# Patient Record
Sex: Female | Born: 1935 | Race: Black or African American | Hispanic: No | Smoking: Never smoker
Health system: Southern US, Community
[De-identification: ages and names within clinical notes are randomized; demographics above are authoritative.]

## PROBLEM LIST (undated history)

## (undated) DIAGNOSIS — G309 Alzheimer's disease, unspecified: Secondary | ICD-10-CM

## (undated) DIAGNOSIS — M1990 Unspecified osteoarthritis, unspecified site: Secondary | ICD-10-CM

## (undated) DIAGNOSIS — I10 Essential (primary) hypertension: Secondary | ICD-10-CM

## (undated) DIAGNOSIS — F0280 Dementia in other diseases classified elsewhere without behavioral disturbance: Secondary | ICD-10-CM

## (undated) DIAGNOSIS — E139 Other specified diabetes mellitus without complications: Secondary | ICD-10-CM

## (undated) HISTORY — PX: EYE SURGERY: SHX253

---

## 2003-03-11 ENCOUNTER — Emergency Department (HOSPITAL_COMMUNITY): Admission: EM | Admit: 2003-03-11 | Discharge: 2003-03-11 | Payer: Self-pay | Admitting: Emergency Medicine

## 2003-10-08 ENCOUNTER — Ambulatory Visit (HOSPITAL_COMMUNITY): Admission: RE | Admit: 2003-10-08 | Discharge: 2003-10-08 | Payer: Self-pay | Admitting: Gastroenterology

## 2003-11-27 ENCOUNTER — Emergency Department (HOSPITAL_COMMUNITY): Admission: EM | Admit: 2003-11-27 | Discharge: 2003-11-28 | Payer: Self-pay | Admitting: Emergency Medicine

## 2004-09-09 ENCOUNTER — Emergency Department (HOSPITAL_COMMUNITY): Admission: EM | Admit: 2004-09-09 | Discharge: 2004-09-09 | Payer: Self-pay | Admitting: Emergency Medicine

## 2007-01-17 ENCOUNTER — Emergency Department (HOSPITAL_COMMUNITY): Admission: EM | Admit: 2007-01-17 | Discharge: 2007-01-17 | Payer: Self-pay | Admitting: Emergency Medicine

## 2007-02-04 ENCOUNTER — Emergency Department (HOSPITAL_COMMUNITY): Admission: EM | Admit: 2007-02-04 | Discharge: 2007-02-04 | Payer: Self-pay | Admitting: Emergency Medicine

## 2007-10-17 ENCOUNTER — Ambulatory Visit: Payer: Self-pay | Admitting: Gastroenterology

## 2008-11-01 ENCOUNTER — Emergency Department (HOSPITAL_COMMUNITY): Admission: EM | Admit: 2008-11-01 | Discharge: 2008-11-01 | Payer: Self-pay | Admitting: Emergency Medicine

## 2010-01-20 ENCOUNTER — Telehealth: Payer: Self-pay | Admitting: Gastroenterology

## 2010-09-21 NOTE — Progress Notes (Signed)
Summary: Schedule NP3 to Discuss Colonoscopy  Phone Note Outgoing Call Call back at [facility] Phone [phone]   Call placed by: Harlow Mares CMA Duncan Dull),  January 20, 2010 10:09 AM Call placed to: Patient Summary of Call: Left message on patients machine to call back. patient due for colonoscopy but will need office visit to discuss colonoscopy due to age Initial call taken by: Harlow Mares CMA Duncan Dull),  January 20, 2010 10:10 AM  Follow-up for Phone Call        brother Morley Kos -[phone] called and wants to let you know that Mrs. Norbeck might not be able to get col done because of full blown Alzeimher but would like to speak to you. Follow-up by: ES, [facility]  Additional Follow-up for Phone Call Additional follow up Details #1::        Left message on patients machine to call back.  Additional Follow-up by: Harlow Mares CMA Duncan Dull),  January 30, 2010 2:32 PM    Additional Follow-up for Phone Call Additional follow up Details #2::    Her POA takes care of her but she stays in daycare during the day and he does not think that in her current mental status she can handle the prep and Colonoscopy. Her Alzeimhers is progressive and he does not feel that a colonoscopy or an office visit is needed at this point. He would like to let Dr. Russella Dar know.  Follow-up by: Harlow Mares CMA Duncan Dull),  January 30, 2010 2:49 PM  Additional Follow-up for Phone Call Additional follow up Details #3:: Details for Additional Follow-up Action Taken: Given progressive dementia ok to cancel recall colonoscopy as they suggest. Additional Follow-up by: Meryl Dare MD Clementeen Graham,  February 05, 2010 5:57 PM

## 2010-11-30 LAB — BASIC METABOLIC PANEL
BUN: 29 mg/dL — ABNORMAL HIGH (ref 6–23)
CO2: 26 mEq/L (ref 19–32)
Calcium: 9.4 mg/dL (ref 8.4–10.5)
Chloride: 104 mEq/L (ref 96–112)
Creatinine, Ser: 1.54 mg/dL — ABNORMAL HIGH (ref 0.4–1.2)
GFR calc Af Amer: 39 mL/min — ABNORMAL LOW (ref >60)
GFR calc non Af Amer: 32 mL/min — ABNORMAL LOW (ref >60)
Glucose, Bld: 240 mg/dL — ABNORMAL HIGH (ref 70–99)
Potassium: 4.4 mEq/L (ref 3.5–5.1)
Sodium: 135 mEq/L (ref 135–145)

## 2010-11-30 LAB — URINALYSIS, ROUTINE W REFLEX MICROSCOPIC
Bilirubin Urine: NEGATIVE
Glucose, UA: NEGATIVE mg/dL
Hgb urine dipstick: NEGATIVE
Ketones, ur: NEGATIVE mg/dL
Nitrite: NEGATIVE
Protein, ur: 30 mg/dL — AB
Specific Gravity, Urine: 1.016 (ref 1.005–1.030)
Urobilinogen, UA: 0.2 mg/dL (ref 0.0–1.0)
pH: 6 (ref 5.0–8.0)

## 2010-11-30 LAB — CBC
HCT: 31.7 % — ABNORMAL LOW (ref 36.0–46.0)
Hemoglobin: 10.2 g/dL — ABNORMAL LOW (ref 12.0–15.0)
MCHC: 32.2 g/dL (ref 30.0–36.0)
MCV: 86.3 fL (ref 78.0–100.0)
Platelets: 270 10*3/uL (ref 150–400)
RBC: 3.68 MIL/uL — ABNORMAL LOW (ref 3.87–5.11)
RDW: 15.5 % (ref 11.5–15.5)
WBC: 8.4 10*3/uL (ref 4.0–10.5)

## 2010-11-30 LAB — DIFFERENTIAL
Basophils Absolute: 0 10*3/uL (ref 0.0–0.1)
Basophils Relative: 0 % (ref 0–1)
Eosinophils Absolute: 0 10*3/uL (ref 0.0–0.7)
Eosinophils Relative: 0 % (ref 0–5)
Lymphocytes Relative: 15 % (ref 12–46)
Lymphs Abs: 1.3 10*3/uL (ref 0.7–4.0)
Monocytes Absolute: 0.7 10*3/uL (ref 0.1–1.0)
Monocytes Relative: 8 % (ref 3–12)
Neutro Abs: 6.4 10*3/uL (ref 1.7–7.7)
Neutrophils Relative %: 76 % (ref 43–77)

## 2010-11-30 LAB — URINE MICROSCOPIC-ADD ON

## 2011-01-02 NOTE — Assessment & Plan Note (Signed)
[facility]                         GASTROENTEROLOGY OFFICE NOTE   Kristina Alvarez, Kristina Alvarez                     MRN:          [id number]  DATE:10/17/2007                            DOB:          Mar 02, 1926    REFERRING PHYSICIAN:  Geoffry Paradise, M.D.   REASON FOR CONSULT:  Suspected hematochezia.   HISTORY OF PRESENT ILLNESS:  Kristina Alvarez is a 77 year old white female,  who has noted small amounts of blood in her Depends.  Initially, vaginal  bleeding was suspected and she was evaluated by Dr. Tracey Harries and  apparently that evaluation was unremarkable and she is now referred to  me for possible hematochezia.  The patient relates that her brother  spotted blood in the bed and in a Depends on two occasions.  It also has  been noted on one occasion, related to a bowel movement.  She previously  underwent colonoscopy in February of 2005, which showed internal and  external hemorrhoids.  A polypoid area of mucosa was removed, which did  not show adenomatous tissue.  She has had occasional episodes of  diarrhea, which have resolved with Imodium.  She notes no other  gastrointestinal symptoms and specifically denies any abdominal pain,  rectal pain, weight-loss or change in stool caliber.  There is no family  history of colon cancer, colon polyps or inflammatory bowel disease.   PAST MEDICAL HISTORY:  Diabetes mellitus  hypertension  anxiety  eczema  sleep apnea.   PAST SURGICAL HISTORY:  Status post tonsillectomy and adenoidectomy.   CURRENT MEDICATIONS:  Listed on the chart, updated and reviewed.   MEDICATION ALLERGIES:  SULFA DRUGS.   SOCIAL HISTORY:  Per the handwritten form.   REVIEW OF SYSTEMS:  Per the handwritten form.   PHYSICAL EXAM:  A thin, elderly, female, in no acute distress.  Height 5 feet 3 inches, weight 105 pounds, blood pressure is 114/60,  pulse 64 and regular.  HEENT EXAM:  Anicteric sclerae.  Oropharynx clear.  CHEST:   Clear to auscultation bilaterally.  CARDIAC:  3/6 systolic murmur, regular rate and rhythm.  ABDOMEN:  Soft, nontender, nondistended.  Normoactive bowel sounds.  No  palpable organomegaly, masses or hernias.  RECTAL EXAMINATION:  Reveals external hemorrhoids, no internal lesions  and brown, trace Hemoccult-positive stool in the vault.  EXTREMITIES:  Without clubbing, cyanosis or edema.  NEUROLOGIC:  Alert and oriented times three.   ASSESSMENT AND PLAN:  Small-volume hematochezia with trace Hemoccult-  positive stool.  She has external hemorrhoids and a history of internal  hemorrhoids, and these are likely the source of her bleeding.  Begin  Anusol-HC suppositories q.h.s. and Anusol HC cream bid for the next two  weeks and then she may use these on a p.r.n. basis.  All standard  hemorrhoidal care instructions were supplied to her.  If her symptoms  persist, consider a flexible sigmoidoscopy or colonoscopy for further  evaluation.     [name]. Russella Dar, MD, [facility]  Electronically Signed    MTS/MedQ  DD: 10/24/2007  DT: 10/24/2007  Job #: [id number]

## 2012-05-15 ENCOUNTER — Emergency Department (HOSPITAL_COMMUNITY)
Admission: EM | Admit: 2012-05-15 | Discharge: 2012-05-15 | Disposition: A | Discharge status: Home / Self Care | Payer: Medicare Other | Attending: Emergency Medicine | Admitting: Emergency Medicine

## 2012-05-15 ENCOUNTER — Emergency Department (HOSPITAL_COMMUNITY): Payer: Medicare Other

## 2012-05-15 DIAGNOSIS — E860 Dehydration: Secondary | ICD-10-CM

## 2012-05-15 DIAGNOSIS — R1110 Vomiting, unspecified: Secondary | ICD-10-CM | POA: Insufficient documentation

## 2012-05-15 DIAGNOSIS — R079 Chest pain, unspecified: Secondary | ICD-10-CM | POA: Insufficient documentation

## 2012-05-15 DIAGNOSIS — F0390 Unspecified dementia without behavioral disturbance: Secondary | ICD-10-CM | POA: Insufficient documentation

## 2012-05-15 DIAGNOSIS — Z79899 Other long term (current) drug therapy: Secondary | ICD-10-CM | POA: Insufficient documentation

## 2012-05-15 LAB — URINALYSIS, ROUTINE W REFLEX MICROSCOPIC
Bilirubin Urine: NEGATIVE
Glucose, UA: NEGATIVE mg/dL
Hgb urine dipstick: NEGATIVE
Protein, ur: NEGATIVE mg/dL
Specific Gravity, Urine: 1.012 (ref 1.005–1.030)

## 2012-05-15 LAB — COMPREHENSIVE METABOLIC PANEL
ALT: 10 U/L (ref 0–35)
AST: 20 U/L (ref 0–37)
Albumin: 3.2 g/dL — ABNORMAL LOW (ref 3.5–5.2)
CO2: 23 mEq/L (ref 19–32)
Chloride: 100 mEq/L (ref 96–112)
Creatinine, Ser: 1.23 mg/dL — ABNORMAL HIGH (ref 0.50–1.10)
GFR calc non Af Amer: 39 mL/min — ABNORMAL LOW (ref >90)
Sodium: 133 mEq/L — ABNORMAL LOW (ref 135–145)
Total Bilirubin: 0.2 mg/dL — ABNORMAL LOW (ref 0.3–1.2)

## 2012-05-15 LAB — CBC WITH DIFFERENTIAL/PLATELET
Basophils Absolute: 0 10*3/uL (ref 0.0–0.1)
Basophils Relative: 0 % (ref 0–1)
HCT: 32.6 % — ABNORMAL LOW (ref 36.0–46.0)
Lymphocytes Relative: 20 % (ref 12–46)
MCHC: 32.2 g/dL (ref 30.0–36.0)
Monocytes Absolute: 0.5 10*3/uL (ref 0.1–1.0)
Neutro Abs: 5.8 10*3/uL (ref 1.7–7.7)
Neutrophils Relative %: 72 % (ref 43–77)
Platelets: 240 10*3/uL (ref 150–400)
RDW: 15.2 % (ref 11.5–15.5)

## 2012-05-15 MED ORDER — SODIUM CHLORIDE 0.9 % IV BOLUS (SEPSIS)
1000.0000 mL | Freq: Once | INTRAVENOUS | Status: AC
  Administered 2012-05-15: 1000 mL via INTRAVENOUS

## 2012-05-15 MED ORDER — SODIUM CHLORIDE 0.9 % IV BOLUS (SEPSIS): 1000.0000 mL | Freq: Once | INTRAVENOUS | Status: DC

## 2012-05-15 NOTE — ED Provider Notes (Signed)
History     CSN: [id number]  Arrival date & time 05/15/12  1133   First MD Initiated Contact with Patient 05/15/12 1157      Chief Complaint  Patient presents with  . Nausea    (Consider location/radiation/quality/duration/timing/severity/associated sxs/prior treatment) HPI Comments: 78 year old female presents to the emergency department via EMS [facility] do to an episode of nausea and vomiting. He told EMS that she appeared fatigued. Her brother drove to the emergency department since he is her caregiver. He states he does not know what happened at the day care today. Last night and this morning he says patient was acting her normal self, he is well, and not fatigued. Brother denies her having any vomiting or diarrhea prior to today. Patient is a little 5 caveat to 2 her Alzheimer's. She does not speak or understand questions. Her brother states this is her baseline, however she does appear more fatigued than normal.  The history is provided by a relative and the EMS personnel. The history is limited by the condition of the patient.    No past medical history on file.  No past surgical history on file.  No family history on file.  History  Substance Use Topics  . Smoking status: Not on file  . Smokeless tobacco: Not on file  . Alcohol Use: Not on file    OB History    No data available      Review of Systems  Unable to perform ROS: Dementia    Allergies  Review of patient's allergies indicates no known allergies.  Home Medications   Current Outpatient Rx  Name Route Sig Dispense Refill  . AMLODIPINE BESYLATE 5 MG PO TABS Oral Take 5 mg by mouth daily.    Marland Kitchen CLONIDINE HCL 0.1 MG PO TABS Oral Take 0.1 mg by mouth every evening.    . DONEPEZIL HCL 10 MG PO TABS Oral Take 10 mg by mouth daily.    Marland Kitchen LINAGLIPTIN-METFORMIN HCL 2.5-500 MG PO TABS Oral Take 1 tablet by mouth 2 (two) times daily.    Marland Kitchen LISINOPRIL-HYDROCHLOROTHIAZIDE 20-12.5 MG PO TABS  Oral Take 1 tablet by mouth daily.    Marland Kitchen MEMANTINE HCL 10 MG PO TABS Oral Take 10 mg by mouth 2 (two) times daily.      BP 116/63  Pulse 46  Temp 96.8 F (36 C) (Rectal)  Resp 12  SpO2 100%  Physical Exam  Constitutional: She appears lethargic. She appears cachectic. Nasal cannula in place.  HENT:  Head: Normocephalic and atraumatic.  Eyes: Conjunctivae normal and EOM are normal. Pupils are equal, round, and reactive to light.  Neck: Neck supple.  Cardiovascular: Normal rate and regular rhythm.  Exam reveals distant heart sounds.   Pulses:      Radial pulses are 2+ on the right side, and 2+ on the left side.       Dorsalis pedis pulses are 0 on the right side, and 0 on the left side.       Posterior tibial pulses are 0 on the right side, and 0 on the left side.  Pulmonary/Chest: Effort normal and breath sounds normal. She has no wheezes. She has no rales.  Abdominal: Soft. Normal appearance and bowel sounds are normal. There is no tenderness.  Musculoskeletal:       Generally weak  Neurological: She appears lethargic. She displays atrophy.       B/l facial droop  Skin: Skin is warm, dry and  intact.  Psychiatric: She is withdrawn. She is noncommunicative.    ED Course  Procedures (including critical care time)   Labs Reviewed  URINALYSIS, ROUTINE W REFLEX MICROSCOPIC  CBC WITH DIFFERENTIAL  COMPREHENSIVE METABOLIC PANEL  URINE CULTURE  TROPONIN I   Dg Chest 1 View  05/15/2012  *RADIOLOGY REPORT*  Clinical Data: Chest pain, shortness of breath, cough, nausea, fever  CHEST - 1 VIEW  Comparison: Portable exam 1317 hours compared to 01/17/2007  Findings: Minimal enlargement of cardiac silhouette. Calcified tortuous aorta. Pulmonary vascularity normal. Prominent skin fold projects over left chest. Bronchitic changes without infiltrate, pleural effusion, or pneumothorax. Diffuse osseous demineralization.  IMPRESSION: Chronic bronchitic changes. Minimal enlargement of cardiac  silhouette. No acute abnormalities.   Original Report Authenticated By: Lollie Marrow, M.D.      1. Dehydration   2. Vomiting       MDM  78 year old female presenting with an episode of vomiting according to her day care. Her brother states she looks fatigued here, however she was fine this morning. No findings explaining patient's vomiting or fatigue. Fluid bolus given. Will obtain a 3 hour troponin and reevaluate. Case discussed with Dr. Lorenso Courier who also evaluated patient and agrees with plan of care.  3:28 PM 3 hour troponin negative. After receiving fluid, patient is alert and awake. Denies abdominal pain or nausea. Brother states she is back to her baseline. Advised to make sure she stays hydrated. Brother will call her PCP Dr. Lorain Childes to schedule a f/u appt.      Trevor Mace, PA-C 05/15/12 1531

## 2012-05-15 NOTE — ED Notes (Signed)
Paged IV team.  RN attempted 3 times

## 2012-05-15 NOTE — ED Notes (Signed)
Per EMS [facility] and had episode of N and V.  Afterward pt seemed lathargic and more fatigued than normal.  Normal baseline is advanced alz and dementia.  Pt presents to ESD with mentation at her baseline.   Pt reports no pain, 128/62 hr 60 CBG 203.  Pt has slight facial droop which is normal

## 2012-05-16 LAB — URINE CULTURE
Colony Count: NO GROWTH
Culture: NO GROWTH

## 2012-05-16 NOTE — ED Provider Notes (Signed)
Medical screening examination/treatment/procedure(s) were conducted as a shared visit with non-physician practitioner(s) and myself.  I personally evaluated the patient during the encounter  Tobin Chad, MD 05/16/12 [phone]

## 2012-05-19 ENCOUNTER — Emergency Department (HOSPITAL_COMMUNITY)
Admission: EM | Admit: 2012-05-19 | Discharge: 2012-05-19 | Disposition: A | Discharge status: Home / Self Care | Payer: Medicare Other | Attending: Emergency Medicine | Admitting: Emergency Medicine

## 2012-05-19 ENCOUNTER — Encounter (HOSPITAL_COMMUNITY): Payer: Self-pay | Admitting: *Deleted

## 2012-05-19 ENCOUNTER — Emergency Department (HOSPITAL_COMMUNITY): Payer: Medicare Other

## 2012-05-19 DIAGNOSIS — G309 Alzheimer's disease, unspecified: Secondary | ICD-10-CM | POA: Insufficient documentation

## 2012-05-19 DIAGNOSIS — F29 Unspecified psychosis not due to a substance or known physiological condition: Secondary | ICD-10-CM | POA: Insufficient documentation

## 2012-05-19 DIAGNOSIS — Z79899 Other long term (current) drug therapy: Secondary | ICD-10-CM | POA: Insufficient documentation

## 2012-05-19 DIAGNOSIS — R4182 Altered mental status, unspecified: Secondary | ICD-10-CM | POA: Insufficient documentation

## 2012-05-19 DIAGNOSIS — F0390 Unspecified dementia without behavioral disturbance: Secondary | ICD-10-CM

## 2012-05-19 DIAGNOSIS — F0280 Dementia in other diseases classified elsewhere without behavioral disturbance: Secondary | ICD-10-CM | POA: Insufficient documentation

## 2012-05-19 HISTORY — DX: Dementia in other diseases classified elsewhere, unspecified severity, without behavioral disturbance, psychotic disturbance, mood disturbance, and anxiety: F02.80

## 2012-05-19 HISTORY — DX: Alzheimer's disease, unspecified: G30.9

## 2012-05-19 HISTORY — DX: Other specified diabetes mellitus without complications: E13.9

## 2012-05-19 HISTORY — DX: Essential (primary) hypertension: I10

## 2012-05-19 LAB — URINALYSIS, ROUTINE W REFLEX MICROSCOPIC
Glucose, UA: NEGATIVE mg/dL
Ketones, ur: NEGATIVE mg/dL
Leukocytes, UA: NEGATIVE
pH: 5.5 (ref 5.0–8.0)

## 2012-05-19 LAB — COMPREHENSIVE METABOLIC PANEL
AST: 19 U/L (ref 0–37)
Albumin: 3.4 g/dL — ABNORMAL LOW (ref 3.5–5.2)
Chloride: 100 mEq/L (ref 96–112)
Creatinine, Ser: 1.57 mg/dL — ABNORMAL HIGH (ref 0.50–1.10)
Sodium: 137 mEq/L (ref 135–145)
Total Bilirubin: 0.1 mg/dL — ABNORMAL LOW (ref 0.3–1.2)

## 2012-05-19 LAB — POCT I-STAT TROPONIN I: Troponin i, poc: 0.01 ng/mL (ref 0.00–0.08)

## 2012-05-19 LAB — CBC WITH DIFFERENTIAL/PLATELET
Basophils Absolute: 0 10*3/uL (ref 0.0–0.1)
Basophils Relative: 0 % (ref 0–1)
HCT: 37.7 % (ref 36.0–46.0)
MCHC: 32.1 g/dL (ref 30.0–36.0)
Monocytes Absolute: 0.6 10*3/uL (ref 0.1–1.0)
Neutro Abs: 7.7 10*3/uL (ref 1.7–7.7)
Neutrophils Relative %: 75 % (ref 43–77)
Platelets: 266 10*3/uL (ref 150–400)
RDW: 15.1 % (ref 11.5–15.5)

## 2012-05-19 LAB — GLUCOSE, CAPILLARY: Glucose-Capillary: 106 mg/dL — ABNORMAL HIGH (ref 70–99)

## 2012-05-19 NOTE — ED Provider Notes (Signed)
History     CSN: [id number]  Arrival date & time 05/19/12  1810   First MD Initiated Contact with Patient 05/19/12 1832      Chief Complaint  Patient presents with  . Altered Mental Status    (Consider location/radiation/quality/duration/timing/severity/associated sxs/prior treatment) Patient is a 78 y.o. female presenting with altered mental status. The history is provided by a relative and the EMS personnel.  Altered Mental Status This is a recurrent problem. The current episode started more than 1 year ago. The problem occurs intermittently. The problem has been resolved. Pertinent negatives include no abdominal pain, chest pain, congestion, coughing, diaphoresis, fever, headaches, nausea, neck pain, rash or vomiting. Nothing aggravates the symptoms. She has tried nothing for the symptoms.    No past medical history on file.  No past surgical history on file.  No family history on file.  History  Substance Use Topics  . Smoking status: Not on file  . Smokeless tobacco: Not on file  . Alcohol Use: Not on file    OB History    No data available      Review of Systems  Constitutional: Positive for activity change. Negative for fever and diaphoresis.  HENT: Negative for ear pain, nosebleeds, congestion and neck pain.   Eyes: Negative for pain and redness.  Respiratory: Negative for cough, choking, chest tightness, shortness of breath and wheezing.   Cardiovascular: Negative for chest pain and palpitations.  Gastrointestinal: Negative for nausea, vomiting, abdominal pain, diarrhea, blood in stool and abdominal distention.  Skin: Negative for rash.  Neurological: Negative for seizures and headaches.  Psychiatric/Behavioral: Positive for confusion and altered mental status.       Baseline advanced alzheimer's dementia     Allergies  Review of patient's allergies indicates no known allergies.  Home Medications   Current Outpatient Rx  Name Route Sig Dispense  Refill  . AMLODIPINE BESYLATE 5 MG PO TABS Oral Take 5 mg by mouth daily.    Marland Kitchen CLONIDINE HCL 0.1 MG PO TABS Oral Take 0.1 mg by mouth every evening.    . DONEPEZIL HCL 10 MG PO TABS Oral Take 10 mg by mouth daily.    Marland Kitchen LINAGLIPTIN-METFORMIN HCL 2.5-500 MG PO TABS Oral Take 1 tablet by mouth 2 (two) times daily.    Marland Kitchen LISINOPRIL-HYDROCHLOROTHIAZIDE 20-12.5 MG PO TABS Oral Take 1 tablet by mouth daily.    Marland Kitchen MEMANTINE HCL 10 MG PO TABS Oral Take 10 mg by mouth 2 (two) times daily.      BP 106/59  Pulse 69  Temp 98.5 F (36.9 C) (Oral)  Resp 16  Ht 5\' 5"  (1.651 m)  Wt 85 lb (38.556 kg)  BMI 14.14 kg/m2  SpO2 97%  Physical Exam  Constitutional: She appears well-developed. No distress.       Frail elderly female    HENT:  Head: Normocephalic and atraumatic.  Nose: Nose normal.  Mouth/Throat: Oropharynx is clear and moist.  Eyes: Right eye exhibits no discharge. Left eye exhibits no discharge. No scleral icterus.  Neck: Normal range of motion. Neck supple. No tracheal deviation present.  Cardiovascular: Normal rate, regular rhythm and intact distal pulses.   Pulmonary/Chest: Effort normal and breath sounds normal. She has no rales.  Abdominal: Soft. Bowel sounds are normal. She exhibits no distension. There is no tenderness. There is no rebound and no guarding.  Musculoskeletal: Normal range of motion. She exhibits no tenderness.  Neurological: She exhibits normal muscle tone.  Baseline per brother   Skin: Skin is warm and dry. No rash noted.    ED Course  Procedures (including critical care time)     Results for orders placed during the hospital encounter of 05/19/12  CBC WITH DIFFERENTIAL      Component Value Range   WBC 10.3  4.0 - 10.5 K/uL   RBC 4.41  3.87 - 5.11 MIL/uL   Hemoglobin 12.1  12.0 - 15.0 g/dL   HCT 45.4  09.8 - 11.9 %   MCV 85.5  78.0 - 100.0 fL   MCH 27.4  26.0 - 34.0 pg   MCHC 32.1  30.0 - 36.0 g/dL   RDW 14.7  82.9 - 56.2 %   Platelets 266   150 - 400 K/uL   Neutrophils Relative 75  43 - 77 %   Neutro Abs 7.7  1.7 - 7.7 K/uL   Lymphocytes Relative 17  12 - 46 %   Lymphs Abs 1.8  0.7 - 4.0 K/uL   Monocytes Relative 6  3 - 12 %   Monocytes Absolute 0.6  0.1 - 1.0 K/uL   Eosinophils Relative 1  0 - 5 %   Eosinophils Absolute 0.1  0.0 - 0.7 K/uL   Basophils Relative 0  0 - 1 %   Basophils Absolute 0.0  0.0 - 0.1 K/uL  COMPREHENSIVE METABOLIC PANEL      Component Value Range   Sodium 137  135 - 145 mEq/L   Potassium 4.5  3.5 - 5.1 mEq/L   Chloride 100  96 - 112 mEq/L   CO2 24  19 - 32 mEq/L   Glucose, Bld 163 (*) 70 - 99 mg/dL   BUN 36 (*) 6 - 23 mg/dL   Creatinine, Ser 1.30 (*) 0.50 - 1.10 mg/dL   Calcium 86.5  8.4 - 78.4 mg/dL   Total Protein 8.3  6.0 - 8.3 g/dL   Albumin 3.4 (*) 3.5 - 5.2 g/dL   AST 19  0 - 37 U/L   ALT 11  0 - 35 U/L   Alkaline Phosphatase 69  39 - 117 U/L   Total Bilirubin 0.1 (*) 0.3 - 1.2 mg/dL   GFR calc non Af Amer 29 (*) >90 mL/min   GFR calc Af Amer 34 (*) >90 mL/min  AMMONIA      Component Value Range   Ammonia 33  11 - 60 umol/L  URINALYSIS, ROUTINE W REFLEX MICROSCOPIC      Component Value Range   Color, Urine YELLOW  YELLOW   APPearance CLEAR  CLEAR   Specific Gravity, Urine 1.017  1.005 - 1.030   pH 5.5  5.0 - 8.0   Glucose, UA NEGATIVE  NEGATIVE mg/dL   Hgb urine dipstick NEGATIVE  NEGATIVE   Bilirubin Urine NEGATIVE  NEGATIVE   Ketones, ur NEGATIVE  NEGATIVE mg/dL   Protein, ur NEGATIVE  NEGATIVE mg/dL   Urobilinogen, UA 0.2  0.0 - 1.0 mg/dL   Nitrite NEGATIVE  NEGATIVE   Leukocytes, UA NEGATIVE  NEGATIVE  POCT I-STAT TROPONIN I      Component Value Range   Troponin i, poc 0.01  0.00 - 0.08 ng/mL   Comment 3           GLUCOSE, CAPILLARY      Component Value Range   Glucose-Capillary 106 (*) 70 - 99 mg/dL   Comment 1 Notify RN         CT Head Wo Contrast (Final result)  Result time:05/19/12 1930    Final result by Rad Results In Interface (05/19/12 19:30:01)     Narrative:   *RADIOLOGY REPORT*  Clinical Data: Altered mental status.  CT HEAD WITHOUT CONTRAST  Technique: Contiguous axial images were obtained from the base of the skull through the vertex without contrast.  Comparison: 01/17/2007  Findings: There is no acute intracranial hemorrhage, infarction, or mass lesion. There is fairly severe cerebral cortical and cerebellar atrophy, increased since the prior exam. No osseous abnormality.  IMPRESSION: No acute abnormality. Progressive moderately severe atrophy.   Original Report Authenticated By: Gwynn Burly, M.D.             DG Chest Portable 1 View (Final result)   Result time:05/19/12 [phone]    Final result by Rad Results In Interface (05/19/12 18:57:41)    Narrative:   *RADIOLOGY REPORT*  Clinical Data: Altered mental status.  PORTABLE CHEST - 1 VIEW  Comparison: 05/15/2012  Findings: Heart size and pulmonary vascularity are normal and the lungs are clear of acute infiltrates and effusions. Slight chronic accentuation of the markings in the right upper lobe.  No acute osseous abnormalities.  IMPRESSION: No acute disease.   Original Report Authenticated By: Gwynn Burly, M.D.      1. Dementia   2. Alzheimer's dementia       MDM     78 yo F in no acute distress, afebrile, vital signs stable, non toxic appearing who presents with  Brief episode of decreased in LOC. Patient with baseline advanced alzheimer's dementia.  Lab work and imaging reassuring. Thorough discussion with family who feel she is back to her baseline.  No evidence of UTI. Head CT with acute findings. At time of discharge patient HDS and per family at her baseline mental status. Will follow up with PCP in 2-3 days.         Nadara Mustard, MD 05/20/12 [phone]

## 2012-05-19 NOTE — ED Notes (Signed)
Per report pt arrived from [facility] with a cc of altered LOC as per family.  Son stated that he picked her up from the adult daycare at 1700 and she was her normal LOC he said at 1715 he noticed that she was only making eye contact but was not responding verbally which is abnormal for her.  On ems arrival pt was noted to not be responding for approx. 15 min. And then she began to interact.  She does follow commands.  Her CBG by EMS was 166.  She is now to her baseline after arrival to the ER.

## 2012-05-19 NOTE — ED Notes (Signed)
Pt. At baseline. Stable, no distress noted. Pt. Family verbalized understanding of discharge instructions.

## 2012-05-20 NOTE — ED Provider Notes (Signed)
I saw and evaluated the patient, reviewed the resident's note and I agree with the findings and plan.   .Face to face Exam:   HEENT:  Atraumatic Resp:  Normal effort Abd:  Nondistended Neuro:No focal weakness Lymph: No adenopathy   Nelia Shi, MD 05/20/12 2357

## 2012-09-09 ENCOUNTER — Emergency Department (HOSPITAL_COMMUNITY): Payer: PRIVATE HEALTH INSURANCE

## 2012-09-09 ENCOUNTER — Encounter (HOSPITAL_COMMUNITY): Payer: Self-pay | Admitting: Emergency Medicine

## 2012-09-09 ENCOUNTER — Emergency Department (HOSPITAL_COMMUNITY)
Admission: EM | Admit: 2012-09-09 | Discharge: 2012-09-09 | Disposition: A | Discharge status: Home / Self Care | Payer: PRIVATE HEALTH INSURANCE | Attending: Emergency Medicine | Admitting: Emergency Medicine

## 2012-09-09 DIAGNOSIS — M129 Arthropathy, unspecified: Secondary | ICD-10-CM | POA: Insufficient documentation

## 2012-09-09 DIAGNOSIS — R55 Syncope and collapse: Secondary | ICD-10-CM | POA: Insufficient documentation

## 2012-09-09 DIAGNOSIS — F0280 Dementia in other diseases classified elsewhere without behavioral disturbance: Secondary | ICD-10-CM | POA: Insufficient documentation

## 2012-09-09 DIAGNOSIS — I10 Essential (primary) hypertension: Secondary | ICD-10-CM | POA: Insufficient documentation

## 2012-09-09 DIAGNOSIS — G309 Alzheimer's disease, unspecified: Secondary | ICD-10-CM | POA: Insufficient documentation

## 2012-09-09 DIAGNOSIS — E119 Type 2 diabetes mellitus without complications: Secondary | ICD-10-CM | POA: Insufficient documentation

## 2012-09-09 DIAGNOSIS — R1110 Vomiting, unspecified: Secondary | ICD-10-CM | POA: Insufficient documentation

## 2012-09-09 DIAGNOSIS — Z79899 Other long term (current) drug therapy: Secondary | ICD-10-CM | POA: Insufficient documentation

## 2012-09-09 HISTORY — DX: Unspecified osteoarthritis, unspecified site: M19.90

## 2012-09-09 LAB — COMPREHENSIVE METABOLIC PANEL
AST: 20 U/L (ref 0–37)
Albumin: 3.3 g/dL — ABNORMAL LOW (ref 3.5–5.2)
Calcium: 9.8 mg/dL (ref 8.4–10.5)
Creatinine, Ser: 1.73 mg/dL — ABNORMAL HIGH (ref 0.50–1.10)

## 2012-09-09 LAB — CBC WITH DIFFERENTIAL/PLATELET
Eosinophils Absolute: 0.1 10*3/uL (ref 0.0–0.7)
Eosinophils Relative: 1 % (ref 0–5)
Hemoglobin: 11.1 g/dL — ABNORMAL LOW (ref 12.0–15.0)
Lymphs Abs: 1.3 10*3/uL (ref 0.7–4.0)
MCH: 27.5 pg (ref 26.0–34.0)
MCV: 86.1 fL (ref 78.0–100.0)
Monocytes Relative: 5 % (ref 3–12)
RBC: 4.04 MIL/uL (ref 3.87–5.11)

## 2012-09-09 LAB — GLUCOSE, CAPILLARY: Glucose-Capillary: 113 mg/dL — ABNORMAL HIGH (ref 70–99)

## 2012-09-09 LAB — POCT I-STAT TROPONIN I: Troponin i, poc: 0.01 ng/mL (ref 0.00–0.08)

## 2012-09-09 NOTE — ED Notes (Signed)
CBG was 113. Notified Nurse Melissa.

## 2012-09-09 NOTE — ED Notes (Signed)
Pt returned from radiology.

## 2012-09-09 NOTE — ED Notes (Signed)
Pt transported to radiology.

## 2012-09-09 NOTE — ED Notes (Signed)
Changed her diaper. She had a bowel movement

## 2012-09-09 NOTE — ED Notes (Signed)
Per EMS pt came from adult day care. Per staff pt vomited and lost consciousness for a few seconds at lunch. Pt did not fall. Pt has no complaints, no pain, no deformities.  20G in L forearm. Pt is oriented to baseline pt has hx of alzheimer's.

## 2012-09-09 NOTE — ED Provider Notes (Signed)
History     CSN: [id number]  Arrival date & time 09/09/12  1245   First MD Initiated Contact with Patient 09/09/12 1302      Chief Complaint  Patient presents with  . Loss of Consciousness     Patient is a 79 y.o. female presenting with vomiting. The history is provided by the patient and a caregiver. The history is limited by the condition of the patient.  Emesis  This is a new problem. Episode onset: just prior to arrival. The problem has been gradually improving. The emesis has an appearance of stomach contents. There has been no fever. Pertinent negatives include no abdominal pain.  pt presents from adult daycare She has h/o advanced dementia While at daycare, she vomited and had episode of LOC.  It was reported that she had LOC for 15 minutes (though EMS reported a few seconds) No seizure reported No falls reported.  No trauma reported.  She is now back to baseline She is accompanied by caregiver from adult daycare Pt lives with brother who is her caregiver He is on the way currently  Past Medical History  Diagnosis Date  . Alzheimer's dementia     Alert to Name only as baseline  . Hypertension   . Diabetes 1.5, managed as type 2   . Arthritis     Past Surgical History  Procedure Date  . Eye surgery     Family History  Problem Relation Age of Onset  . Diabetes Brother     History  Substance Use Topics  . Smoking status: Not on file  . Smokeless tobacco: Not on file  . Alcohol Use: No    OB History    Grav Para Term Preterm Abortions TAB SAB Ect Mult Living                  Review of Systems  Unable to perform ROS: Dementia  Gastrointestinal: Positive for vomiting. Negative for abdominal pain.    Allergies  Review of patient's allergies indicates no known allergies.  Home Medications   Current Outpatient Rx  Name  Route  Sig  Dispense  Refill  . AMLODIPINE BESYLATE 5 MG PO TABS   Oral   Take 5 mg by mouth daily.         Marland Kitchen CLONIDINE HCL  0.1 MG PO TABS   Oral   Take 0.1 mg by mouth every evening.         . DONEPEZIL HCL 10 MG PO TABS   Oral   Take 10 mg by mouth daily.         Marland Kitchen LINAGLIPTIN-METFORMIN HCL 2.5-500 MG PO TABS   Oral   Take 1 tablet by mouth 2 (two) times daily.         Marland Kitchen LISINOPRIL-HYDROCHLOROTHIAZIDE 20-12.5 MG PO TABS   Oral   Take 1 tablet by mouth daily.         Marland Kitchen MEMANTINE HCL 10 MG PO TABS   Oral   Take 10 mg by mouth 2 (two) times daily.           BP 102/51  Pulse 65  Temp 97.4 F (36.3 C) (Oral)  Resp 16  SpO2 100%  Physical Exam CONSTITUTIONAL: Well developed/well nourished HEAD AND FACE: Normocephalic/atraumatic EYES: EOMI ENMT: Mucous membranes dry NECK: supple no meningeal signs CV: S1/S2 noted, no murmurs/rubs/gallops noted LUNGS: Lungs are clear to auscultation bilaterally, no apparent distress ABDOMEN: soft, nontender, no rebound or guarding NEURO: Pt  is awake/alert, moves all extremitiesx4. She is pleasantly demented. She does not answer most questions EXTREMITIES: pulses normal, full ROM, no deformity, no tenderness noted SKIN: warm, color normal PSYCH:dementia noted  ED Course  Procedures    Labs Reviewed  COMPREHENSIVE METABOLIC PANEL  CBC WITH DIFFERENTIAL  LIPASE, BLOOD   3:54 PM Pt now at baseline.  Her brother is here (caregiver) and reports this is very common for her.  He reports she is at her baseline.  She is in no distress, no focal weakness is noted.  I doubt acute cardiac dysrhythmia as cause.  She is mildly dehydrated but otherwise no other acute findings.  Pt is poor historian so unclear if she had prodrome.  She has f/u with her PCP this week (aronson) Brother feels very comfortable taking her home  MDM  Nursing notes including past medical history and social history reviewed and considered in documentation Labs/vital reviewed and considered Previous records reviewed and considered - previous ED notes reviewed similar episode  previously        Date: 09/09/2012  Rate: 77  Rhythm: normal sinus rhythm  QRS Axis: normal  Intervals: normal  ST/T Wave abnormalities: nonspecific ST changes  Conduction Disutrbances:none  Narrative Interpretation: artifact noted, difficult to fully interpret     Joya Gaskins, MD 09/09/12 1556

## 2013-03-17 ENCOUNTER — Emergency Department (HOSPITAL_COMMUNITY): Payer: Medicare Other

## 2013-03-17 ENCOUNTER — Emergency Department (HOSPITAL_COMMUNITY)
Admission: EM | Admit: 2013-03-17 | Discharge: 2013-03-17 | Disposition: A | Discharge status: Home / Self Care | Payer: Medicare Other | Attending: Emergency Medicine | Admitting: Emergency Medicine

## 2013-03-17 DIAGNOSIS — Z23 Encounter for immunization: Secondary | ICD-10-CM | POA: Insufficient documentation

## 2013-03-17 DIAGNOSIS — Y9389 Activity, other specified: Secondary | ICD-10-CM | POA: Insufficient documentation

## 2013-03-17 DIAGNOSIS — IMO0002 Reserved for concepts with insufficient information to code with codable children: Secondary | ICD-10-CM | POA: Insufficient documentation

## 2013-03-17 DIAGNOSIS — T07XXXA Unspecified multiple injuries, initial encounter: Secondary | ICD-10-CM

## 2013-03-17 DIAGNOSIS — W101XXA Fall (on)(from) sidewalk curb, initial encounter: Secondary | ICD-10-CM | POA: Insufficient documentation

## 2013-03-17 DIAGNOSIS — Y92410 Unspecified street and highway as the place of occurrence of the external cause: Secondary | ICD-10-CM | POA: Insufficient documentation

## 2013-03-17 DIAGNOSIS — W19XXXA Unspecified fall, initial encounter: Secondary | ICD-10-CM

## 2013-03-17 DIAGNOSIS — F0390 Unspecified dementia without behavioral disturbance: Secondary | ICD-10-CM | POA: Insufficient documentation

## 2013-03-17 DIAGNOSIS — S023XXA Fracture of orbital floor, initial encounter for closed fracture: Secondary | ICD-10-CM

## 2013-03-17 DIAGNOSIS — S0230XA Fracture of orbital floor, unspecified side, initial encounter for closed fracture: Secondary | ICD-10-CM | POA: Insufficient documentation

## 2013-03-17 DIAGNOSIS — Z79899 Other long term (current) drug therapy: Secondary | ICD-10-CM | POA: Insufficient documentation

## 2013-03-17 MED ORDER — CEPHALEXIN 250 MG PO CAPS: 250.0000 mg | ORAL_CAPSULE | Freq: Four times a day (QID) | ORAL | Status: DC

## 2013-03-17 MED ORDER — CEPHALEXIN 250 MG PO CAPS
250.0000 mg | ORAL_CAPSULE | Freq: Once | ORAL | Status: AC
  Administered 2013-03-17: 250 mg via ORAL
  Filled 2013-03-17: qty 1

## 2013-03-17 MED ORDER — TETANUS-DIPHTHERIA TOXOIDS TD 5-2 LFU IM INJ
0.5000 mL | INJECTION | Freq: Once | INTRAMUSCULAR | Status: AC
  Administered 2013-03-17: 0.5 mL via INTRAMUSCULAR
  Filled 2013-03-17: qty 0.5

## 2013-03-17 NOTE — ED Provider Notes (Signed)
Medical screening examination/treatment/procedure(s) were performed by non-physician practitioner and as supervising physician I was immediately available for consultation/collaboration.  Ethelda Chick, MD 03/17/13 2352

## 2013-03-17 NOTE — ED Provider Notes (Signed)
CSN: [id number]     Arrival date & time 03/17/13  1936 History     First MD Initiated Contact with Patient 03/17/13 1955     Chief Complaint  Patient presents with  . Fall   (Consider location/radiation/quality/duration/timing/severity/associated sxs/prior Treatment) HPI Comments: Patient has advanced Alzheimer's.  She lives with her brother, who is her primary caregiver, on his arrival.  He gives the history that he fell asleep watching the news and his sister walked out of the house.  She could not have been gone.  More than 15 or 20 minutes.  She was found by neighbors to have fallen to the Road.  She was assisted back home at this time.  She has a hematoma and superficial abrasions/lacerations to her right orbital area, including ecchymosis of the upper lid, shows a abrasion to her nose, and upper lip.  Per her brother, she is at her baseline mental status  Patient is a 79 y.o. female presenting with fall. The history is provided by a caregiver. The history is limited by the condition of the patient.  Fall This is a new problem. The current episode started today. The problem has been unchanged. Pertinent negatives include no fever.    No past medical history on file. No past surgical history on file. No family history on file. History  Substance Use Topics  . Smoking status: Not on file  . Smokeless tobacco: Not on file  . Alcohol Use: Not on file   OB History   No data available     Review of Systems  Unable to perform ROS: Dementia  Constitutional: Negative for fever.  Skin: Positive for wound.  All other systems reviewed and are negative.    Allergies  Review of patient's allergies indicates no known allergies.  Home Medications   Current Outpatient Rx  Name  Route  Sig  Dispense  Refill  . amLODipine (NORVASC) 5 MG tablet   Oral   Take 5 mg by mouth every morning.          . donepezil (ARICEPT) 10 MG tablet   Oral   Take 10 mg by mouth every morning.         . Linagliptin-Metformin HCl (JENTADUETO) 2.5-500 MG TABS   Oral   Take 1 tablet by mouth every morning.          Marland Kitchen lisinopril-hydrochlorothiazide (PRINZIDE,ZESTORETIC) 20-12.5 MG per tablet   Oral   Take 1 tablet by mouth every morning.          . memantine (NAMENDA) 10 MG tablet   Oral   Take 10 mg by mouth 2 (two) times daily.          BP 150/77  Pulse 71  Temp(Src) 99.7 F (37.6 C)  Resp 18  SpO2 100% Physical Exam  Nursing note and vitals reviewed. Constitutional: She appears well-developed and well-nourished.  HENT:  Head: Normocephalic.    Right Ear: External ear normal.  Left Ear: External ear normal.  Mouth/Throat: Oropharynx is clear and moist.  Eyes: Pupils are equal, round, and reactive to light.  Follows with eyes no co pain with eye movement   Neck: Normal range of motion.  Cardiovascular: Normal rate and regular rhythm.   Pulmonary/Chest: Effort normal.  Abdominal: Soft.  Musculoskeletal: Normal range of motion. She exhibits no tenderness.  Neurological: She is alert.  Skin: Skin is warm. No rash noted. No erythema.    ED Course   Procedures (including critical care time)  Labs Reviewed - No data to display No results found. No diagnosis found.  MDM   Due to mechanism of fall, will CT head, maxillofacial and cervical spine.  We'll update patient's tetanus status  X-ray results are not crossing over a dispute to the radiologist.  She reports, that she has degenerative changes in her C-spine her head shows normal.  Atrophy for portion with advanced Alzheimer's disease, but the maxillofacial CT shows that she has a mildly depressed right orbital floor fracture with fat extruding, but no muscle entrapment. On reexamination of the patient.  She is moving her eyes in all directions without complaint of pain or visual disturbance.  Brother, who is her caregiver has been informed of the CT finding Will start patient on Keflex 4times daily for  7 days and ENT follow up as needed  Arman Filter, NP 03/17/13 2303

## 2013-03-17 NOTE — ED Notes (Signed)
PER EMS: alzheimer's pt escaped from facility, found on street picking up garbage, witnesses state she fell off a curb while she was picking up trash, no LOC. Ems reports vitals 160/80, HR-70, CBG-110, GCS 13 due to alzheimer's. Alert and oriented but disoriented to time.

## 2013-07-07 ENCOUNTER — Emergency Department (HOSPITAL_COMMUNITY): Payer: Medicare Other

## 2013-07-07 ENCOUNTER — Observation Stay (HOSPITAL_COMMUNITY)
Admission: EM | Admit: 2013-07-07 | Discharge: 2013-07-09 | Disposition: A | Discharge status: Home / Self Care | Payer: Medicare Other | Attending: Family Medicine | Admitting: Family Medicine

## 2013-07-07 ENCOUNTER — Encounter (HOSPITAL_COMMUNITY): Payer: Self-pay | Admitting: Emergency Medicine

## 2013-07-07 ENCOUNTER — Inpatient Hospital Stay (HOSPITAL_COMMUNITY): Payer: Medicare Other

## 2013-07-07 DIAGNOSIS — F0280 Dementia in other diseases classified elsewhere without behavioral disturbance: Secondary | ICD-10-CM | POA: Insufficient documentation

## 2013-07-07 DIAGNOSIS — Z79899 Other long term (current) drug therapy: Secondary | ICD-10-CM | POA: Insufficient documentation

## 2013-07-07 DIAGNOSIS — E119 Type 2 diabetes mellitus without complications: Secondary | ICD-10-CM | POA: Insufficient documentation

## 2013-07-07 DIAGNOSIS — G309 Alzheimer's disease, unspecified: Secondary | ICD-10-CM | POA: Insufficient documentation

## 2013-07-07 DIAGNOSIS — Z23 Encounter for immunization: Secondary | ICD-10-CM | POA: Insufficient documentation

## 2013-07-07 DIAGNOSIS — I10 Essential (primary) hypertension: Secondary | ICD-10-CM | POA: Insufficient documentation

## 2013-07-07 DIAGNOSIS — M129 Arthropathy, unspecified: Secondary | ICD-10-CM | POA: Insufficient documentation

## 2013-07-07 DIAGNOSIS — R55 Syncope and collapse: Principal | ICD-10-CM | POA: Insufficient documentation

## 2013-07-07 LAB — URINALYSIS, ROUTINE W REFLEX MICROSCOPIC
Bilirubin Urine: NEGATIVE
Glucose, UA: NEGATIVE mg/dL
Hgb urine dipstick: NEGATIVE
Nitrite: NEGATIVE
Protein, ur: NEGATIVE mg/dL
Urobilinogen, UA: 0.2 mg/dL (ref 0.0–1.0)

## 2013-07-07 LAB — CBC WITH DIFFERENTIAL/PLATELET
Basophils Absolute: 0 10*3/uL (ref 0.0–0.1)
Basophils Relative: 0 % (ref 0–1)
Hemoglobin: 11 g/dL — ABNORMAL LOW (ref 12.0–15.0)
MCHC: 31.5 g/dL (ref 30.0–36.0)
Monocytes Relative: 7 % (ref 3–12)
Neutro Abs: 5.2 10*3/uL (ref 1.7–7.7)
Neutrophils Relative %: 77 % (ref 43–77)
Platelets: 264 10*3/uL (ref 150–400)

## 2013-07-07 LAB — BASIC METABOLIC PANEL
BUN: 38 mg/dL — ABNORMAL HIGH (ref 6–23)
CO2: 22 mEq/L (ref 19–32)
Calcium: 9.8 mg/dL (ref 8.4–10.5)
GFR calc non Af Amer: 28 mL/min — ABNORMAL LOW (ref >90)
Glucose, Bld: 103 mg/dL — ABNORMAL HIGH (ref 70–99)
Sodium: 141 mEq/L (ref 135–145)

## 2013-07-07 LAB — TROPONIN I: Troponin I: <0.3 ng/mL (ref <0.30)

## 2013-07-07 LAB — GLUCOSE, CAPILLARY: Glucose-Capillary: 77 mg/dL (ref 70–99)

## 2013-07-07 MED ORDER — LISINOPRIL-HYDROCHLOROTHIAZIDE 20-12.5 MG PO TABS: 1.0000 | ORAL_TABLET | Freq: Every day | ORAL | Status: DC

## 2013-07-07 MED ORDER — SODIUM CHLORIDE 0.9 % IV BOLUS (SEPSIS): 500.0000 mL | Freq: Once | INTRAVENOUS | Status: DC

## 2013-07-07 MED ORDER — LISINOPRIL 20 MG PO TABS
20.0000 mg | ORAL_TABLET | Freq: Every day | ORAL | Status: DC
  Filled 2013-07-07: qty 1

## 2013-07-07 MED ORDER — CLONIDINE HCL 0.1 MG PO TABS
0.1000 mg | ORAL_TABLET | Freq: Every evening | ORAL | Status: DC
  Administered 2013-07-08: 0.1 mg via ORAL
  Filled 2013-07-07 (×3): qty 1

## 2013-07-07 MED ORDER — SODIUM CHLORIDE 0.9 % IV SOLN: 250.0000 mL | INTRAVENOUS | Status: DC | PRN

## 2013-07-07 MED ORDER — HYDROCHLOROTHIAZIDE 12.5 MG PO CAPS
12.5000 mg | ORAL_CAPSULE | Freq: Every day | ORAL | Status: DC
  Administered 2013-07-08 – 2013-07-09 (×2): 12.5 mg via ORAL
  Filled 2013-07-07 (×2): qty 1

## 2013-07-07 MED ORDER — ASPIRIN 300 MG RE SUPP
300.0000 mg | Freq: Every day | RECTAL | Status: DC
  Administered 2013-07-07 – 2013-07-08 (×2): 300 mg via RECTAL
  Filled 2013-07-07 (×2): qty 1

## 2013-07-07 MED ORDER — ASPIRIN 81 MG PO CHEW: 81.0000 mg | CHEWABLE_TABLET | Freq: Every day | ORAL | Status: DC

## 2013-07-07 MED ORDER — SODIUM CHLORIDE 0.9 % IJ SOLN: 3.0000 mL | INTRAMUSCULAR | Status: DC | PRN

## 2013-07-07 MED ORDER — SODIUM CHLORIDE 0.9 % IV SOLN: INTRAVENOUS | Status: DC

## 2013-07-07 MED ORDER — SODIUM CHLORIDE 0.9 % IJ SOLN
3.0000 mL | Freq: Two times a day (BID) | INTRAMUSCULAR | Status: DC
  Administered 2013-07-07: 3 mL via INTRAVENOUS

## 2013-07-07 MED ORDER — PNEUMOCOCCAL VAC POLYVALENT 25 MCG/0.5ML IJ INJ
0.5000 mL | INJECTION | INTRAMUSCULAR | Status: AC
  Administered 2013-07-09: 0.5 mL via INTRAMUSCULAR
  Filled 2013-07-07 (×2): qty 0.5

## 2013-07-07 MED ORDER — DONEPEZIL HCL 10 MG PO TABS
10.0000 mg | ORAL_TABLET | Freq: Every morning | ORAL | Status: DC
  Administered 2013-07-08 – 2013-07-09 (×2): 10 mg via ORAL
  Filled 2013-07-07 (×2): qty 1

## 2013-07-07 MED ORDER — SODIUM CHLORIDE 0.9 % IJ SOLN
3.0000 mL | Freq: Two times a day (BID) | INTRAMUSCULAR | Status: DC
  Administered 2013-07-07 – 2013-07-09 (×4): 3 mL via INTRAVENOUS

## 2013-07-07 MED ORDER — AMLODIPINE BESYLATE 5 MG PO TABS
5.0000 mg | ORAL_TABLET | Freq: Every day | ORAL | Status: DC
  Administered 2013-07-08 – 2013-07-09 (×2): 5 mg via ORAL
  Filled 2013-07-07 (×2): qty 1

## 2013-07-07 NOTE — ED Notes (Signed)
In and out patient dark yellow urine in return

## 2013-07-07 NOTE — ED Notes (Signed)
Brother states he was called from the adult day care, stating his sister had possibly had a seizure. Pt unable to give accurate information. Pt is alert, no incontinence noted. Not oriented, but brother states that is baseline.

## 2013-07-07 NOTE — H&P (Signed)
Family Medicine Teaching [facility] Admission History and Physical Service Pager: [phone]  Patient name: Kristina Alvarez Medical record number: [id number] Date of birth: [birth year] Age: 79 y.o. Gender: female  Primary Care Provider: Pcp Not In System Consultants: none Code Status: full (confirmed with brother)  Chief Complaint: altered mental status  Assessment and Plan: Kristina Alvarez is a 79 y.o. female presenting with an episode of AMS at home facility . 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  #AMS/alzheimers dementia- Episode this afternoon described as syncopal after coughing on piece of toast, requiring 2L [place] for desats. No convulsions or twitching noted. But hx provided secondhand. Pt has not had episode similar to this for >1 year. No hx of aspiration per brother but is hand fed, has to be instructed to eat more slowly. Has had dementia for 10years (on aricept). Walks with assistance/incontinent of bladder at baseline. W/up in the ED neg to date. Afebrile, no white count or evidence of infectious etiology. Does have clear pupillary discrepancy which has not been previously documented in notes. CT head neg -echo especially given murmur -repeat EKG in am, cycle trops -CXRs -fall precautions, 4 bed restraints -close vs monitoring, continuous tele -consider MRI given eye exam -cont aricept -bedside swallow screen -PT/OT eval -consider MRI of head  #HTN- on norvasc 5mg ,clonidine 0.1 mg and prinzide 20-12.5mg ; BP normotensive here. Would allow for permissive htn given pupillary discrepancy and possible stroke etiology until proven otherwise -already had medications this morning -will restart home meds   #DM- on jentadueto at home, will hold here, Cr 1.59 but this is patient's baseline -CBGs with sensitive scale -A1C in am  #arthritis- no acute complaints at this time -tylenol prn  FEN/GI: NPO until bedside swallow  study by SLP, SLIV Prophylaxis: SCDs  Disposition: admit to tele pending syncopal workup  History of Present Illness: Kristina Alvarez is a 79 y.o. female presenting with episode of AMS noted at pt's day home facility this afternoon. Per pt brother and staff at home pt was sitting up eating a piece of toast when she appeared to roll her eyes back in her head and body slumped over. After some stimulation pt had hard cough, with production of mucus and a small piece of toast. No shaking/twitching noted. EMS was called for syncopal episode and pt was placed on 2L  for desat at that time. Per hx provided by brother, only recent illness was some mild URI sx, but no change in daily functioning (from baseline alzheimers) fevers chills appetite or change in urine output/stool production. Pt is incontinent of bladder at baseline.   Of note this is patient's third episode of similar presentation in the past few years without full syncopal work up. Last episode around sept of last year.  In the ED CBC, BMET without acute abnomalities. Hgb 11.Glucose 103.  Cr 1.59 but at patient's baseline. U/A neg. Trop X1 neg. CT head without intracranial process.    Review Of Systems: Per HPI with the following additions: none Otherwise 12 point review of systems was performed and was unremarkable.  There are no active problems to display for this patient.  Past Medical History: Past Medical History  Diagnosis Date  . Alzheimer's dementia     Alert to Name only as baseline  . Hypertension   . Diabetes 1.5, managed as type 2   . Arthritis    Past Surgical History: Past Surgical History  Procedure Laterality  Date  . Eye surgery     Social History: History  Substance Use Topics  . Smoking status: Never Smoker   . Smokeless tobacco: Not on file  . Alcohol Use: No   Additional social history: lives at home with brother when not at day center, he helps her with all her ADLs Please also refer to relevant  sections of EMR.  Family History: Family History  Problem Relation Age of Onset  . Diabetes Brother    Allergies and Medications: No Known Allergies No current facility-administered medications on file prior to encounter.   Current Outpatient Prescriptions on File Prior to Encounter  Medication Sig Dispense Refill  . acetaminophen (TYLENOL) 500 MG tablet Take 500-1,000 mg by mouth every 4 (four) hours as needed. For pain      . amLODipine (NORVASC) 5 MG tablet Take 5 mg by mouth daily.      . calcium carbonate (TUMS - DOSED IN MG ELEMENTAL CALCIUM) 500 MG chewable tablet Chew 1-2 tablets by mouth every 4 (four) hours as needed. For heartburn      . cloNIDine (CATAPRES) 0.1 MG tablet Take 0.1 mg by mouth every evening.      . donepezil (ARICEPT) 10 MG tablet Take 10 mg by mouth every morning.       . Linagliptin-Metformin HCl (JENTADUETO) 2.5-500 MG TABS Take 1 tablet by mouth 2 (two) times daily.      Marland Kitchen lisinopril-hydrochlorothiazide (PRINZIDE,ZESTORETIC) 20-12.5 MG per tablet Take 1 tablet by mouth daily.      Marland Kitchen loperamide (IMODIUM) 2 MG capsule Take 2 mg by mouth 4 (four) times daily as needed. For loose stool        Objective: BP 128/76  Pulse 72  Temp(Src) 97.7 F (36.5 C) (Oral)  Resp 19  SpO2 100% Exam: General: lying in bed, giggling, hiding under bedsheets HEENT: pupillary discordance (left pinpoint, right 4-39mm) not reactive to light, MMM, poor dentition Cardiovascular: loud systolic murmur 3/6 at RSB, cap refill <3 sec Respiratory: CTAB, no wheezing Abdomen: soft, nt, nd, in diaper Extremities: WWP, missing small toe on bilat feet, 2+ DP pulses bilaterally Skin: no evidence of skin breakdown, warm, dry, no sacral decubitus ulcers or heel ulcers Neuro: oriented to self only (per brother this is her baseline), does not follow commands but cooperates with exam  Labs and Imaging: CBC BMET   Recent Labs Lab 07/07/13 1143  WBC 6.7  HGB 11.0*  HCT 34.9*  PLT 264     Recent Labs Lab 07/07/13 1143  NA 141  K 4.7  CL 107  CO2 22  BUN 38*  CREATININE 1.59*  GLUCOSE 103*  CALCIUM 9.8     U/A neg EKG- no evidence of acute abnormality CT head- without intracranial process  Anselm Lis, MD 07/07/2013, 4:10 PM PGY-1, [facility] FPTS Intern pager: [phone], text pages welcome   Upper Level Addendum:  I have seen and evaluated this patient along with Dr. Michail Jewels and reviewed the above note, making necessary revisions in pink.   Levert Feinstein, MD Family Medicine PGY-2

## 2013-07-07 NOTE — ED Notes (Addendum)
Pt was at [facility] and staff noticed that pt had slumpped in chair and  Her eyes rolled up into her head  They placed pt on floor and rolled her to rt side she spit up toast and thick phlegm and she came back around  Pulse ox was low at first and ems placed her on 2 l [place] and it came back up to 95%

## 2013-07-07 NOTE — ED Notes (Signed)
Report given to Kristi, RN.

## 2013-07-07 NOTE — ED Notes (Signed)
Patient transported to CT 

## 2013-07-07 NOTE — ED Provider Notes (Signed)
CSN: [id number]     Arrival date & time 07/07/13  1052 History   First MD Initiated Contact with Patient 07/07/13 1137     Chief Complaint  Patient presents with  . Near Syncope   (Consider location/radiation/quality/duration/timing/severity/associated sxs/prior Treatment)  CODE STATUS: FULL CODE   Patient is a 79 y.o. female presenting with syncope. The history is provided by a relative. The history is limited by the condition of the patient. No language interpreter was used.  Loss of Consciousness Episode history:  Single Most recent episode:  Today Progression:  Resolved Chronicity:  New Context: sitting down   Witnessed: yes   Worsened by:  Nothing tried  [place] is a 79 y.o.female with a significant PMH of advanced Alzheimer's dementia, HTN, and DM II presents to the ER with complaints of possible syncopal episode at adult care facility.  Pt brother has power of attorney and states the desire for Full code status, "do everything you can, I want any and all testing and work done".  Pt history is coming from brother and facility staff. Patient is unable to answer question related to incident. Patient was sitting up eating in a chair when staff noticed she appeared to have had a syncopal episode when the staff attempted to rouse patient she sat up and coughed expelling a piece of toast and some mucus. Patient had decrease SPO2 at that time, started on 2L by nasal canula by EMS with improvement.  Per brother patient has had mild clear rhinorrea since this morning. Brother denies any recent illness or change in base line symptoms of Alzheimer's. Denies fever, chills, increase urine output, no blood noted in stool or urine in diaper.   Past Medical History  Diagnosis Date  . Alzheimer's dementia     Alert to Name only as baseline  . Hypertension   . Diabetes 1.5, managed as type 2   . Arthritis    Past Surgical History  Procedure Laterality Date  . Eye surgery     Family  History  Problem Relation Age of Onset  . Diabetes Brother    History  Substance Use Topics  . Smoking status: Never Smoker   . Smokeless tobacco: Not on file  . Alcohol Use: No   OB History   Grav Para Term Preterm Abortions TAB SAB Ect Mult Living                 Review of Systems  Cardiovascular: Positive for syncope.  LEVEL 5 : AMS  Allergies  Review of patient's allergies indicates no known allergies.  Home Medications   Current Outpatient Rx  Name  Route  Sig  Dispense  Refill  . acetaminophen (TYLENOL) 500 MG tablet   Oral   Take 500-1,000 mg by mouth every 4 (four) hours as needed. For pain         . amLODipine (NORVASC) 5 MG tablet   Oral   Take 5 mg by mouth daily.         . calcium carbonate (TUMS - DOSED IN MG ELEMENTAL CALCIUM) 500 MG chewable tablet   Oral   Chew 1-2 tablets by mouth every 4 (four) hours as needed. For heartburn         . cloNIDine (CATAPRES) 0.1 MG tablet   Oral   Take 0.1 mg by mouth every evening.         . donepezil (ARICEPT) 10 MG tablet   Oral   Take 10 mg  by mouth every morning.          . Linagliptin-Metformin HCl (JENTADUETO) 2.5-500 MG TABS   Oral   Take 1 tablet by mouth 2 (two) times daily.         Marland Kitchen lisinopril-hydrochlorothiazide (PRINZIDE,ZESTORETIC) 20-12.5 MG per tablet   Oral   Take 1 tablet by mouth daily.         Marland Kitchen loperamide (IMODIUM) 2 MG capsule   Oral   Take 2 mg by mouth 4 (four) times daily as needed. For loose stool          BP 162/79  Pulse 68  Temp(Src) 97.7 F (36.5 C) (Oral)  Resp 20  SpO2 97% Physical Exam  Nursing note and vitals reviewed. Constitutional: She appears well-developed and well-nourished. No distress.  HENT:  Head: Normocephalic and atraumatic.  Eyes: Pupils are equal, round, and reactive to light.  Neck: Normal range of motion. Neck supple.  Cardiovascular: Normal rate and regular rhythm.   Pulmonary/Chest: Effort normal.  Abdominal: Soft.   Neurological: She is alert.  Skin: Skin is warm and dry.  Psychiatric: She has a normal mood and affect. Her speech is normal and behavior is normal.  Patient is unable to answer any direct questions at this time. Does not know where she is, what happened, or who her brother is. This is her baseline per her brother.    ED Course  Procedures (including critical care time) Labs Review Labs Reviewed  CBC WITH DIFFERENTIAL - Abnormal; Notable for the following:    Hemoglobin 11.0 (*)    HCT 34.9 (*)    RDW 15.9 (*)    All other components within normal limits  BASIC METABOLIC PANEL - Abnormal; Notable for the following:    Glucose, Bld 103 (*)    BUN 38 (*)    Creatinine, Ser 1.59 (*)    GFR calc non Af Amer 28 (*)    GFR calc Af Amer 33 (*)    All other components within normal limits  TROPONIN I  URINALYSIS, ROUTINE W REFLEX MICROSCOPIC   Imaging Review Ct Head Wo Contrast  07/07/2013   CLINICAL DATA:  Altered mental status.  EXAM: CT HEAD WITHOUT CONTRAST  TECHNIQUE: Contiguous axial images were obtained from the base of the skull through the vertex without intravenous contrast.  COMPARISON:  09/09/2012  FINDINGS: No intracranial hemorrhage.  Small vessel disease type changes most notable right parietal lobe without CT evidence of large acute infarct.  Global atrophy.  Ventricular prominence similar to prior exam.  No intracranial mass lesion noted on this unenhanced exam.  Vascular calcifications.  IMPRESSION: No intracranial hemorrhage or CT evidence of large acute infarct.  Please see above.   Electronically Signed   By: Bridgett Larsson M.D.   On: 07/07/2013 12:57    EKG Interpretation   None       MDM   1. Syncope    No obvious cause of patients syncope. This is the third episode of the same in the past year and she has not had syncopal work-up done. Therefore I have spoken with Dr.Chambliss' resident about admission for syncopal work-up and they have agreed to admit. Temp  orders placed for Tele. Dr. Rubin Payor aware of plan.      Dorthula Matas, PA-C 07/07/13 1436

## 2013-07-08 DIAGNOSIS — R55 Syncope and collapse: Principal | ICD-10-CM

## 2013-07-08 DIAGNOSIS — I359 Nonrheumatic aortic valve disorder, unspecified: Secondary | ICD-10-CM

## 2013-07-08 LAB — GLUCOSE, CAPILLARY
Glucose-Capillary: 55 mg/dL — ABNORMAL LOW (ref 70–99)
Glucose-Capillary: 62 mg/dL — ABNORMAL LOW (ref 70–99)
Glucose-Capillary: 85 mg/dL (ref 70–99)
Glucose-Capillary: 126 mg/dL — ABNORMAL HIGH (ref 70–99)

## 2013-07-08 LAB — BASIC METABOLIC PANEL
Chloride: 107 mEq/L (ref 96–112)
Creatinine, Ser: 1.38 mg/dL — ABNORMAL HIGH (ref 0.50–1.10)
GFR calc Af Amer: 39 mL/min — ABNORMAL LOW (ref >90)
Glucose, Bld: 123 mg/dL — ABNORMAL HIGH (ref 70–99)
Potassium: 4.7 mEq/L (ref 3.5–5.1)
Sodium: 142 mEq/L (ref 135–145)

## 2013-07-08 LAB — TROPONIN I
Troponin I: <0.3 ng/mL (ref <0.30)
Troponin I: <0.3 ng/mL (ref <0.30)

## 2013-07-08 LAB — HEMOGLOBIN A1C: Hgb A1c MFr Bld: 6.3 % — ABNORMAL HIGH (ref <5.7)

## 2013-07-08 LAB — CBC
HCT: 31.2 % — ABNORMAL LOW (ref 36.0–46.0)
Hemoglobin: 10.1 g/dL — ABNORMAL LOW (ref 12.0–15.0)
MCH: 28.1 pg (ref 26.0–34.0)
MCV: 86.7 fL (ref 78.0–100.0)
RDW: 15.8 % — ABNORMAL HIGH (ref 11.5–15.5)
WBC: 14.5 10*3/uL — ABNORMAL HIGH (ref 4.0–10.5)

## 2013-07-08 MED ORDER — ASPIRIN 81 MG PO CHEW
81.0000 mg | CHEWABLE_TABLET | Freq: Every day | ORAL | Status: DC
  Administered 2013-07-09: 81 mg via ORAL

## 2013-07-08 NOTE — Evaluation (Signed)
Clinical/Bedside Swallow Evaluation Patient Details  Name: Kristina Alvarez MRN: [id number] Date of Birth: 1926/07/22  Today's Date: 07/08/2013 Time: 4132-4401 SLP Time Calculation (min): 21 min  Past Medical History:  Past Medical History  Diagnosis Date  . Alzheimer's dementia     Alert to Name only as baseline  . Hypertension   . Diabetes 1.5, managed as type 2   . Arthritis    Past Surgical History:  Past Surgical History  Procedure Laterality Date  . Eye surgery     HPI:  79 y.o. female presenting with an episode of AMS at home facility . PMH is significant for alzheimers dementia, HTN, diabetes, arthritis. Episode this afternoon described as syncopal after coughing on piece of toast, requiring 2L [place] for desats. Pt has not had episode similar to this for >1 year. No hx of aspiration per brother but is hand fed, has to be instructed to eat more slowly. Has had dementia for 10years (on aricept). Walks with assistance/incontinent of bladder at baseline. W/up in the ED neg to date. Afebrile, no white count or evidence of infectious etiology.  CT No intracranial hemorrhage or CT evidence of large acute infarct.  CXR No evidence for significant aspiration.    Assessment / Plan / Recommendation Clinical Impression  Pt. demonstrated a cognitve based swallow dysfunction due to significant impulsivity and requiring total tactile assist for slower pace and smaller bites (attempts to stuff mouth, huge bites).  Strong and immediate cough x 1 out of 10 trials of cup/straw sips thin likely from large and uncontrolled sip.  Total assist to remove cup/straw to control volume.  Mildly decreased mastication with cracker despite having few teeth.  SLP recommends Dys 2 diet texture due to impulsivity and decreased mastication, thin liquids and FULL supervision/assist (SLP educated Engineer, site), pills crushed, small straw sips ok.  SLP will follow briefly.      Aspiration Risk  Severe    Diet  Recommendation Dysphagia 2 (Fine chop);Thin liquid   Liquid Administration via: Straw;Cup Medication Administration: Crushed with puree Supervision: Staff to assist with self feeding;Full supervision/cueing for compensatory strategies;Trained caregiver to feed patient Compensations: Slow rate;Small sips/bites;Check for pocketing Postural Changes and/or Swallow Maneuvers: Seated upright 90 degrees    Other  Recommendations Recommended Consults:  (dietitian) Oral Care Recommendations: Oral care BID   Follow Up Recommendations  None    Frequency and Duration min 1 x/week  2 weeks   Pertinent Vitals/Pain n/a         Swallow Study           Oral/Motor/Sensory Function Overall Oral Motor/Sensory Function:  (unable to formally assess)   Ice Chips Ice chips: Not tested   Thin Liquid Thin Liquid: Impaired Presentation: Cup;Spoon;Straw Oral Phase Impairments: Reduced lingual movement/coordination Oral Phase Functional Implications: Left anterior spillage;Right anterior spillage Pharyngeal  Phase Impairments: Cough - Immediate (audible swallow)    Nectar Thick Nectar Thick Liquid: Not tested   Honey Thick Honey Thick Liquid: Not tested   Puree Puree: Impaired Pharyngeal Phase Impairments:  (audible swallow)   Solid   GO    Solid: Impaired Oral Phase Impairments: Reduced lingual movement/coordination Pharyngeal Phase Impairments: Multiple swallows       Breck Coons Kyung Muto M.Ed [employer] [phone]  07/08/2013

## 2013-07-08 NOTE — Progress Notes (Signed)
OT Cancellation Note  Patient Details Name: PHOENIX RIESEN MRN: [id number] DOB: 1925/09/22   Cancelled Treatment:    Reason Eval/Treat Not Completed: OT screened, no needs identified, will sign off. Pt not appropriate for acute OT services. Pt unable to follow simple commands, speech unintelligible, pt resistive to mobility and pt confused  Galen Manila 07/08/2013, 11:12 AM

## 2013-07-08 NOTE — Evaluation (Signed)
Physical Therapy Evaluation Patient Details Name: Kristina Alvarez MRN: [id number] DOB: 11/21/1925 Today's Date: 07/08/2013 Time: 4098-1191 PT Time Calculation (min): 30 min  PT Assessment / Plan / Recommendation History of Present Illness  Episode this afternoon described as syncopal after coughing on piece of toast, requiring 2L [place] for desats. No convulsions or twitching noted. But hx provided secondhand. Pt has not had episode similar to this for >1 year. No hx of aspiration per brother but is hand fed, has to be instructed to eat more slowly. Has had dementia for 10years (on aricept). Walks with assistance/incontinent of bladder at baseline. W/up in the ED neg to date. Afebrile, no white count or evidence of infectious etiology. Does have clear pupillary discrepancy which has not been previously documented in notes. CT head neg  Clinical Impression  Patient is unable to communicate with therapy, but per her brother this is her baseline.  Patient is too cognitively impaired to answer questions, but her brother is present to help.  She is able to follow one step commands and walk with assistance.  She will be safe to d/c home to live with her brother again, as long as he is still willing to provide continuous care when she isn't at the adult day center.  Patient will continue to benefit from skilled PT intervention in the acute setting to address the functional limitations listed below.    PT Assessment  Patient needs continued PT services    Follow Up Recommendations  Supervision/Assistance - 24 hour          Equipment Recommendations  None recommended by PT       Frequency Min 3X/week    Precautions / Restrictions Precautions Precautions: Fall Restrictions Weight Bearing Restrictions: No   Pertinent Vitals/Pain None reported       Mobility  Bed Mobility Bed Mobility: Supine to Sit Supine to Sit: 2: Max assist Details for Bed Mobility Assistance: Requires tactile cues to  move LE to EOB, requires B hand hold to pull into sitting upright Transfers Transfers: Stand to Sit;Sit to Stand Sit to Stand: 4: Min assist Stand to Sit: 4: Min assist Details for Transfer Assistance: B UE and trunk support for safety while standing.  Once movement is initiated, she has the strength to stand up on her own. Ambulation/Gait Ambulation/Gait Assistance: 1: +2 Total assist Ambulation/Gait: Patient Percentage: 80% Ambulation Distance (Feet): 300 Feet Assistive device: 2 person hand held assist Ambulation/Gait Assistance Details: Tolerates head turns well, requires assistance for safety to due her lack of awareness, poor posture, and weakness. Gait Pattern: Step-through pattern    Exercises     PT Diagnosis: Difficulty walking;Generalized weakness  PT Problem List: Decreased strength;Decreased activity tolerance;Decreased balance;Decreased mobility;Decreased coordination;Decreased cognition;Decreased safety awareness PT Treatment Interventions: DME instruction;Gait training;Stair training;Functional mobility training;Therapeutic activities;Therapeutic exercise;Neuromuscular re-education     PT Goals(Current goals can be found in the care plan section) Acute Rehab PT Goals Patient Stated Goal: return home PT Goal Formulation: With patient Time For Goal Achievement: 07/22/13 Potential to Achieve Goals: Good  Visit Information  Last PT Received On: 07/08/13 Reason Eval/Treat Not Completed: Other (comment) (Bed rest an) History of Present Illness: Episode this afternoon described as syncopal after coughing on piece of toast, requiring 2L [place] for desats. No convulsions or twitching noted. But hx provided secondhand. Pt has not had episode similar to this for >1 year. No hx of aspiration per brother but is hand fed, has to be instructed to eat more slowly.  Has had dementia for 10years (on aricept). Walks with assistance/incontinent of bladder at baseline. W/up in the ED neg to  date. Afebrile, no white count or evidence of infectious etiology. Does have clear pupillary discrepancy which has not been previously documented in notes. CT head neg       Prior Functioning  Home Living Family/patient expects to be discharged to:: Private residence Living Arrangements: Other relatives (Brother) Available Help at Discharge: Family Type of Home: House Home Access: Level entry Home Layout: One level Home Equipment: None Prior Function Level of Independence: Needs assistance Gait / Transfers Assistance Needed: yes ADL's / Homemaking Assistance Needed: yes Communication / Swallowing Assistance Needed: yes Communication Communication: Other (comment) (decreased due to advanced dementia)    Cognition  Cognition Arousal/Alertness: Awake/alert Behavior During Therapy: Impulsive Overall Cognitive Status: History of cognitive impairments - at baseline    Extremity/Trunk Assessment Upper Extremity Assessment Upper Extremity Assessment: Defer to OT evaluation Lower Extremity Assessment Lower Extremity Assessment: Overall WFL for tasks assessed   Balance    End of Session PT - End of Session Equipment Utilized During Treatment: Gait belt Activity Tolerance: Patient tolerated treatment well Patient left: in bed;with call bell/phone within reach Nurse Communication: Mobility status  GP    Barrie Dunker, SPT Pager:  [phone]  Barrie Dunker 07/08/2013, 5:40 PM

## 2013-07-08 NOTE — Discharge Summary (Signed)
Family Medicine Teaching [facility] Discharge Summary  Patient name: Kristina Alvarez Medical record number: [id number] Date of birth: 11-19-1925 Age: 79 y.o. Gender: female Date of Admission: 07/07/2013  Date of Discharge: 07/09/13 Admitting Physician: Carney Living, MD  Primary Care Provider: Pcp Not In System Consultants: None  Indication for Hospitalization: syncopal episode  Discharge Diagnoses/Problem List:  -Syncope -Alzheimers dementia -HTN -DMII -Arthritis   Disposition: to home with 24/7 care from brother and day facility when brother not at home  Discharge Condition: stable  Discharge Exam:  Temp: [97.3 F (36.3 C)-98.2 F (36.8 C)] 98.2 F (36.8 C) (11/20 0420)  Pulse Rate: [55-70] 68 (11/20 0420)  Resp: [16-18] 18 (11/20 0420)  BP: (98-163)/(52-80) 117/80 mmHg (11/20 0420)  SpO2: [92 %-100 %] 96 % (11/20 0420)  Physical Exam:  General: lying in bed, giggling, hiding under bedsheets  HEENT: pupillary discordance (left pinpoint, right 4-69mm) not reactive to light, MMM, poor dentition  Cardiovascular: diastolic murmur 3/6 at RSB, cap refill <3 sec  Respiratory: CTAB, no wheezing  Abdomen: soft, nt, nd, in diaper  Extremities: WWP, missing small toe on bilat feet, good DPs  Skin: no evidence of skin breakdown, warm, dry, no sacral decubitus ulcers or heel ulcers  Neuro: oriented to self only, cooperative   Brief Hospital Course:  Kristina Alvarez is a 79 y.o. female presenting with an episode of AMS at home facility . 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  #AMS/alzheimers dementia- Pt had episode of AMS at adult day care facility on DOA which was described as a syncopal event after coughing on piece of toast, requiring 2L [place] for desats per EMS. No convulsions or twitching noted. But hx provided secondhand by pt's brother who did not actually witness event. Pt has not had episode similar to this  for >1 year. No hx of aspiration per brother but is hand fed, has to be instructed to eat more slowly on a regular basis. Has had dementia for 10years (on aricept), no acute change in daily functioning. Walks with assistance/incontinent of bladder at baseline. W/up in the ED neg. CT head without acute process. Afebrile, no white count or evidence of infectious etiology. Pt was admitted for full syncopal w/up including cycled trops, echo, repeat EKG. Was started on home med aricept. Was noted to have clear pupillary discrepancy which has not been previously documented in notes. But suspect this chronic. No signs of increased intracranial pressure/herniation.Cycled trops neg. Repeat EKG with LVH but otherwise nml  Echo was performed  With EF of 65-70. Most likely episode vasovagal related. SLP consulted for feeding issues and was recommending dysphagia 2 diet. Pt with a lot of behavioral issues related to eating. Should continued to be closely monitored with PO intake at home.   #HTN- On norvasc 5mg ,clonidine 0.1 mg and prinzide 20-12.5mg  at home; BP normotensive here. Given elevated Cr to 1.59 (appears close to pt's baseline) lisinopril was d/c'd and HCTZ reordered as separate medication. Pt will continue on norvasc and clonidine as outpt. PCP may need to adjust further given low-normal bps here  #DM- On jentadueto at home. Held here and Cr 1.59. CBGs 50s-100s. A1C 6.3. Suspect pt does not need to be on diabetic medication any longer, esp not metformin given creatinine level. Jantadueto stopped  #arthritis- No acute complaints throughout admission, will cont on home tylenol prn  Issues for Follow Up:  1. Continued episodes of hypoglycemia off jantadueto 2.  BP med adjustments  Significant Procedures: None  Significant Labs and Imaging:   Recent Labs Lab 07/07/13 1143 07/08/13 0126  WBC 6.7 14.5*  HGB 11.0* 10.1*  HCT 34.9* 31.2*  PLT 264 272    Recent Labs Lab 07/07/13 1143 07/08/13 0126   NA 141 142  K 4.7 4.7  CL 107 107  CO2 22 25  GLUCOSE 103* 123*  BUN 38* 34*  CREATININE 1.59* 1.38*  CALCIUM 9.8 9.8    2D echo- Left ventricle: The cavity size was normal. There was focal basal hypertrophy. Systolic function was vigorous. The estimated ejection fraction was in the range of 65% to 70%. Wall motion was normal; there were no regional wall motion abnormalities. There was an increased relative contribution of atrial contraction to ventricular filling. - Aortic valve: Mild regurgitation.   Results/Tests Pending at Time of Discharge: none  Discharge Medications:    Medication List    STOP taking these medications       JENTADUETO 2.5-500 MG Tabs  Generic drug:  Linagliptin-Metformin HCl     lisinopril-hydrochlorothiazide 20-12.5 MG per tablet  Commonly known as:  PRINZIDE,ZESTORETIC      TAKE these medications       acetaminophen 500 MG tablet  Commonly known as:  TYLENOL  Take 500-1,000 mg by mouth every 4 (four) hours as needed. For pain     amLODipine 5 MG tablet  Commonly known as:  NORVASC  Take 5 mg by mouth daily.     aspirin 81 MG chewable tablet  Chew 1 tablet (81 mg total) by mouth daily.     calcium carbonate 500 MG chewable tablet  Commonly known as:  TUMS - dosed in mg elemental calcium  Chew 1-2 tablets by mouth every 4 (four) hours as needed. For heartburn     cloNIDine 0.1 MG tablet  Commonly known as:  CATAPRES  Take 0.1 mg by mouth every evening.     donepezil 10 MG tablet  Commonly known as:  ARICEPT  Take 10 mg by mouth every morning.     loperamide 2 MG capsule  Commonly known as:  IMODIUM  Take 2 mg by mouth 4 (four) times daily as needed. For loose stool        Discharge Instructions: Please refer to Patient Instructions section of EMR for full details.  Patient was counseled important signs and symptoms that should prompt return to medical care, changes in medications, dietary instructions, activity restrictions,  and follow up appointments.   Follow-Up Appointments: Follow-up Information   Schedule an appointment as soon as possible for a visit with No PCP Per Patient. (Call for appointment for hospital f/up in next week)    Specialty:  General Practice   Contact information:   [address] [place] [address] [phone]       Anselm Lis, MD 07/09/2013, 12:24 PM PGY-1, [facility]

## 2013-07-08 NOTE — Evaluation (Signed)
Kristina Alvarez, PT, DPT [phone]  Read and agree

## 2013-07-08 NOTE — Progress Notes (Addendum)
Family Medicine Teaching Service Daily Progress Note Intern Pager: [phone]  Patient name: Kristina Alvarez Medical record number: [id number] Date of birth: 23-Sep-1925 Age: 79 y.o. Gender: female  Primary Care Provider: Pcp Not In System Consultants: none Code Status: full  Pt Overview and Major Events to Date:   Assessment and Plan: #AMS/alzheimers dementia- Syncopal vs seizure like?  Dementia for 10years (on aricept). Still with clear pupillary discrepancy which has not been previously documented in notes. Unchanged from yesterday. CT head neg. CXRs without evidence of aspiration. No obvious infectious etiology but elevated WBC this am. Cycled trops neg -echo especially given murmur  -repeat EKG this am -fall precautions, 4 bed restraints  -close vs monitoring, continuous tele  -consider MRI given eye exam  -cont aricept  -bedside swallow screen  -PT/OT eval  -ASA suppository  #HTN- on norvasc 5mg ,clonidine 0.1 mg and prinzide 20-12.5mg ; BP soft here  -will d/c lisinopril, cont rest of meds as above  #DM- on jentadueto at home, will hold here, Cr 1.59 but this is patient's baseline , improved to 1.38 this am -CBGs with sensitive scale- 100s this am -A1C today  #arthritis- no acute complaints at this time  -tylenol prn  FEN/GI: NPO until bedside swallow study by SLP Prophylaxis: SCDs  Disposition: pending syncopal work up, PO evaluation  Subjective: "hello, I feel fine" No acute complaints, continuing to mumble and hide under sheets  Objective: Temp:  [97.7 F (36.5 C)-98.6 F (37 C)] 98 F (36.7 C) (11/19 0600) Pulse Rate:  [60-91] 67 (11/19 0600) Resp:  [18-23] 18 (11/19 0600) BP: (100-165)/(54-91) 111/54 mmHg (11/19 0600) SpO2:  [93 %-100 %] 100 % (11/19 0600) Weight:  [84 lb (38.102 kg)] 84 lb (38.102 kg) (11/18 1700) Physical Exam: General: lying in bed, giggling, hiding under bedsheets  HEENT: pupillary discordance (left pinpoint, right 4-57mm) not reactive to  light, MMM, poor dentition  Cardiovascular: loud systolic murmur 3/6 at RSB, cap refill <3 sec  Respiratory: CTAB, no wheezing  Abdomen: soft, nt, nd, in diaper  Extremities: WWP, missing small toe on bilat feet, good DPs Skin: no evidence of skin breakdown, warm, dry, no sacral decubitus ulcers or heel ulcers  Neuro: oriented to self only, cooperative  Laboratory:  Recent Labs Lab 07/07/13 1143 07/08/13 0126  WBC 6.7 14.5*  HGB 11.0* 10.1*  HCT 34.9* 31.2*  PLT 264 272    Recent Labs Lab 07/07/13 1143 07/08/13 0126  NA 141 142  K 4.7 4.7  CL 107 107  CO2 22 25  BUN 38* 34*  CREATININE 1.59* 1.38*  CALCIUM 9.8 9.8  GLUCOSE 103* 123*    U/A neg  EKG- no evidence of acute abnormality    Imaging/Diagnostic Tests: CT head- without intracranial process  Anselm Lis, MD 07/08/2013, 7:31 AM PGY-1, [facility] FPTS Intern pager: [phone], text pages welcome

## 2013-07-08 NOTE — Progress Notes (Signed)
Hypoglycemic Event  CBG: 55  Treatment: meal tray, 8oz OJ  Symptoms: Hungry  Follow-up CBG: Time:1306 CBG Result:62  Possible Reasons for Event: Inadequate meal intake  Comments/MD notified:yes     Jezreel Sisk C  Remember to initiate Hypoglycemia Order Set & complete

## 2013-07-08 NOTE — H&P (Signed)
Family Medicine Teaching Service Attending Note  I interviewed and examined patient Kristina Alvarez and reviewed their tests and x-rays.  I discussed with Dr. Pollie Meyer and reviewed their note for today.  I agree with their assessment and plan.     Additionally  This am is alert can give her name but no other oral information Able to stand and walk to chair holding on Drank water well  Syncope?  No obv cause.  Check echo and monitor 24 hours. Physical Therapy evaluation Large fixed right pupil likely post surgical - no signs of ICP or stroke

## 2013-07-08 NOTE — Progress Notes (Signed)
  Echocardiogram 2D Echocardiogram has been performed.  Kristina Alvarez 07/08/2013, 6:22 PM

## 2013-07-09 LAB — GLUCOSE, CAPILLARY
Glucose-Capillary: 109 mg/dL — ABNORMAL HIGH (ref 70–99)
Glucose-Capillary: 118 mg/dL — ABNORMAL HIGH (ref 70–99)
Glucose-Capillary: 142 mg/dL — ABNORMAL HIGH (ref 70–99)
Glucose-Capillary: 159 mg/dL — ABNORMAL HIGH (ref 70–99)

## 2013-07-09 MED ORDER — ASPIRIN 81 MG PO CHEW: 81.0000 mg | CHEWABLE_TABLET | Freq: Every day | ORAL | Status: AC

## 2013-07-09 NOTE — Progress Notes (Signed)
Speech Language Pathology Treatment: Dysphagia  Patient Details Name: Kristina Alvarez MRN: [id number] DOB: 1925/10/07 Today's Date: 07/09/2013 Time: 1035-1050 SLP Time Calculation (min): 15 min  Assessment / Plan / Recommendation Clinical Impression  F/u diagnostic treatment at bedside revealed no overt indication of aspiration with clinician provided po trials. Patient does require total assist for use of swallowing precautions due to impulsivity with self feeding and limited mastication of solids prior to oral transit and initiation of swallow increasing aspiration risks. She will continue to require this assistance level long term given dementia diagnosis. No acute SLP f/u indicated in the acute level of care. May benefit from brief f/u at SNF from SLP to ensure accurate diet in place and for caregiver education.    HPI HPI: 79 y.o. female presenting with an episode of AMS at home facility . PMH is significant for alzheimers dementia, HTN, diabetes, arthritis. Episode this afternoon described as syncopal after coughing on piece of toast, requiring 2L [place] for desats. Pt has not had episode similar to this for >1 year. No hx of aspiration per brother but is hand fed, has to be instructed to eat more slowly. Has had dementia for 10years (on aricept). Walks with assistance/incontinent of bladder at baseline. W/up in the ED neg to date. Afebrile, no white count or evidence of infectious etiology.  CT No intracranial hemorrhage or CT evidence of large acute infarct.  CXR No evidence for significant aspiration.       SLP Plan  All goals met    Recommendations Diet recommendations: Dysphagia 2 (fine chop);Thin liquid Liquids provided via: Cup;Straw Medication Administration: Crushed with puree Supervision: Staff to assist with self feeding;Full supervision/cueing for compensatory strategies;Trained caregiver to feed patient Compensations: Slow rate;Small sips/bites;Check for pocketing Postural  Changes and/or Swallow Maneuvers: Seated upright 90 degrees              Oral Care Recommendations: Oral care BID Follow up Recommendations: Skilled Nursing facility (brief to ensure accurate diet in place and for education) Plan: All goals met    GO   [name] MA, CCC-SLP [phone]   [name] Meryl 07/09/2013, 10:50 AM

## 2013-07-09 NOTE — Discharge Summary (Signed)
Family Medicine Teaching Service Attending Note  I interviewed and examined patient Kristina Alvarez and reviewed their tests and x-rays.  I discussed with Dr. Michail Jewels and reviewed their note for today.  I agree with their assessment and plan.     Additionally  Interactive but not oriented Syncope?  - no etiology found on in patient work up  Likely choking episode - keep on appropriate diet

## 2013-07-13 ENCOUNTER — Encounter (HOSPITAL_COMMUNITY): Payer: Self-pay | Admitting: Emergency Medicine

## 2013-07-13 ENCOUNTER — Emergency Department (HOSPITAL_COMMUNITY): Payer: Medicare Other

## 2013-07-13 ENCOUNTER — Observation Stay (HOSPITAL_COMMUNITY)
Admission: EM | Admit: 2013-07-13 | Discharge: 2013-07-15 | Disposition: A | Discharge status: Home / Self Care | Payer: Medicare Other | Attending: Family Medicine | Admitting: Family Medicine

## 2013-07-13 DIAGNOSIS — R627 Adult failure to thrive: Secondary | ICD-10-CM

## 2013-07-13 DIAGNOSIS — M129 Arthropathy, unspecified: Secondary | ICD-10-CM | POA: Insufficient documentation

## 2013-07-13 DIAGNOSIS — F0280 Dementia in other diseases classified elsewhere without behavioral disturbance: Secondary | ICD-10-CM

## 2013-07-13 DIAGNOSIS — R55 Syncope and collapse: Principal | ICD-10-CM | POA: Diagnosis present

## 2013-07-13 DIAGNOSIS — Z7982 Long term (current) use of aspirin: Secondary | ICD-10-CM | POA: Insufficient documentation

## 2013-07-13 DIAGNOSIS — Z515 Encounter for palliative care: Secondary | ICD-10-CM

## 2013-07-13 DIAGNOSIS — I10 Essential (primary) hypertension: Secondary | ICD-10-CM

## 2013-07-13 DIAGNOSIS — G309 Alzheimer's disease, unspecified: Secondary | ICD-10-CM | POA: Insufficient documentation

## 2013-07-13 DIAGNOSIS — M1990 Unspecified osteoarthritis, unspecified site: Secondary | ICD-10-CM

## 2013-07-13 DIAGNOSIS — E875 Hyperkalemia: Secondary | ICD-10-CM

## 2013-07-13 DIAGNOSIS — R4020 Unspecified coma: Secondary | ICD-10-CM

## 2013-07-13 DIAGNOSIS — Z79899 Other long term (current) drug therapy: Secondary | ICD-10-CM | POA: Insufficient documentation

## 2013-07-13 DIAGNOSIS — R531 Weakness: Secondary | ICD-10-CM

## 2013-07-13 DIAGNOSIS — E119 Type 2 diabetes mellitus without complications: Secondary | ICD-10-CM | POA: Insufficient documentation

## 2013-07-13 LAB — COMPREHENSIVE METABOLIC PANEL
ALT: 13 U/L (ref 0–35)
AST: 26 U/L (ref 0–37)
Albumin: 3.3 g/dL — ABNORMAL LOW (ref 3.5–5.2)
Alkaline Phosphatase: 87 U/L (ref 39–117)
BUN: 40 mg/dL — ABNORMAL HIGH (ref 6–23)
CO2: 22 mEq/L (ref 19–32)
Calcium: 9.9 mg/dL (ref 8.4–10.5)
Chloride: 105 mEq/L (ref 96–112)
Creatinine, Ser: 1.6 mg/dL — ABNORMAL HIGH (ref 0.50–1.10)
GFR calc Af Amer: 33 mL/min — ABNORMAL LOW (ref >90)
GFR calc non Af Amer: 28 mL/min — ABNORMAL LOW (ref >90)
Glucose, Bld: 160 mg/dL — ABNORMAL HIGH (ref 70–99)
Potassium: 6.2 mEq/L — ABNORMAL HIGH (ref 3.5–5.1)
Sodium: 139 mEq/L (ref 135–145)
Total Bilirubin: 0.2 mg/dL — ABNORMAL LOW (ref 0.3–1.2)
Total Protein: 8.3 g/dL (ref 6.0–8.3)

## 2013-07-13 LAB — CBC WITH DIFFERENTIAL/PLATELET
Basophils Absolute: 0 10*3/uL (ref 0.0–0.1)
Basophils Relative: 0 % (ref 0–1)
Eosinophils Absolute: 0.1 10*3/uL (ref 0.0–0.7)
Eosinophils Relative: 1 % (ref 0–5)
HCT: 34.9 % — ABNORMAL LOW (ref 36.0–46.0)
Hemoglobin: 11.3 g/dL — ABNORMAL LOW (ref 12.0–15.0)
Lymphocytes Relative: 16 % (ref 12–46)
Lymphs Abs: 1.3 10*3/uL (ref 0.7–4.0)
MCH: 28.7 pg (ref 26.0–34.0)
MCHC: 32.4 g/dL (ref 30.0–36.0)
MCV: 88.6 fL (ref 78.0–100.0)
Monocytes Absolute: 1 10*3/uL (ref 0.1–1.0)
Monocytes Relative: 12 % (ref 3–12)
Neutro Abs: 6.1 10*3/uL (ref 1.7–7.7)
Neutrophils Relative %: 72 % (ref 43–77)
Platelets: 277 10*3/uL (ref 150–400)
RBC: 3.94 MIL/uL (ref 3.87–5.11)
RDW: 16.1 % — ABNORMAL HIGH (ref 11.5–15.5)
WBC: 8.4 10*3/uL (ref 4.0–10.5)

## 2013-07-13 LAB — TROPONIN I: Troponin I: <0.3 ng/mL (ref <0.30)

## 2013-07-13 LAB — POCT I-STAT 3, ART BLOOD GAS (G3+)
O2 Saturation: 97 %
pCO2 arterial: 35 mmHg (ref 35.0–45.0)
pH, Arterial: 7.41 (ref 7.350–7.450)

## 2013-07-13 LAB — URINALYSIS, ROUTINE W REFLEX MICROSCOPIC
Bilirubin Urine: NEGATIVE
Glucose, UA: NEGATIVE mg/dL
Hgb urine dipstick: NEGATIVE
Ketones, ur: NEGATIVE mg/dL
Leukocytes, UA: NEGATIVE
Nitrite: NEGATIVE
Protein, ur: NEGATIVE mg/dL
Specific Gravity, Urine: 1.017 (ref 1.005–1.030)
Urobilinogen, UA: 0.2 mg/dL (ref 0.0–1.0)
pH: 5.5 (ref 5.0–8.0)

## 2013-07-13 LAB — CG4 I-STAT (LACTIC ACID): Lactic Acid, Venous: 2.54 mmol/L — ABNORMAL HIGH (ref 0.5–2.2)

## 2013-07-13 NOTE — ED Notes (Signed)
CG-4 shown to PA

## 2013-07-13 NOTE — H&P (Signed)
Family Medicine Teaching [facility] Admission History and Physical Service Pager: [phone]  Patient name: Kristina Alvarez Medical record number: [id number] Date of birth: December 01, 1925 Age: 79 y.o. Gender: female  Primary Care Provider: Pcp Not In System Consultants: None Code Status: Full code  Chief Complaint: Loss of consciousness  Assessment and Plan: Kristina Alvarez is a 79 y.o. female presenting with loss of consciousness. PMH is significant for syncope, hypertension, DM type2.  # Loss of consciousness: syncopal episode vs seizure. History not entirely convincing for seizure from witnessed account. Had period that is suggestive of postictal period. Had a previous syncope workup last week. EKG unchanged from previous. Last echo on 07/08/2013 showed EF of 65-70%.  CT head negative.   Admit to telemetry unit  Cardiac monitoring  Will consider neurology consult in AM and possible EEG  # Hyperkalemia: no EKG changes suggesting hyperkalemia. Might be due to hemolysis.  Follow-up repeat revealed normal K+ of 5.1  Follow-up AM bmet  # Chronic conditions  Alzheimer's dementia: on aricept. Held until SLP have recommendations.  Obtaining PT/OT evaluations.  Hypertension: on aspirin, clonidine and amlodipine at home. Held until SLP have recommendations  Diabetes mellitus, type 2: Not currently on any medications outpatient.  Well controlled.  Last A1C on 11/19 was 6.3  Arthritis: has tylenol for pain. Held until SLP have recommendations  FEN/GI:   NPO until SLP eval complete  MIVF: NS @100ml /hr  Prophylaxis: lovenox  Disposition: admit to telemetry for observation  History of Present Illness: Kristina Alvarez is a 79 y.o. female presenting with loss of consciousness. While at home, around 8PM, Kristina Alvarez was sitting in a chair when her brother noticed her mouth vibrating followed by her leaning over like she was about to fall. She had associated drooling. He did not  witness any other movements, loss of consciousness or loss of bowel. She would not respond to voice commands after the episode for about 30 minutes. By the time EMS arrived, she was starting to move towards baseline. This episode was different than her last episode of loss of consciousness because of the mouth movements. She has had no fever, chills, nausea, vomiting. She walks with assistance, with no cane or walker. Her brothher is POA.  Review Of Systems: Per HPI with the following additions:  Otherwise 12 point review of systems was performed and was unremarkable.  There are no active problems to display for this patient.  Past Medical History: Past Medical History  Diagnosis Date  . Alzheimer's dementia     Alert to Name only as baseline  . Hypertension   . Diabetes 1.5, managed as type 2   . Arthritis    Past Surgical History: Past Surgical History  Procedure Laterality Date  . Eye surgery     Social History: History  Substance Use Topics  . Smoking status: Never Smoker   . Smokeless tobacco: Not on file  . Alcohol Use: No   Additional social history:   Please also refer to relevant sections of EMR.  Family History: Family History  Problem Relation Age of Onset  . Diabetes Brother    Allergies and Medications: No Known Allergies No current facility-administered medications on file prior to encounter.   Current Outpatient Prescriptions on File Prior to Encounter  Medication Sig Dispense Refill  . acetaminophen (TYLENOL) 500 MG tablet Take 500-1,000 mg by mouth every 4 (four) hours as needed. For pain      . amLODipine (NORVASC) 5 MG  tablet Take 5 mg by mouth daily.      Marland Kitchen aspirin 81 MG chewable tablet Chew 1 tablet (81 mg total) by mouth daily.  30 tablet  0  . calcium carbonate (TUMS - DOSED IN MG ELEMENTAL CALCIUM) 500 MG chewable tablet Chew 1-2 tablets by mouth every 4 (four) hours as needed. For heartburn      . cloNIDine (CATAPRES) 0.1 MG tablet Take 0.1 mg by  mouth every evening.      . donepezil (ARICEPT) 10 MG tablet Take 10 mg by mouth every morning.       . loperamide (IMODIUM) 2 MG capsule Take 2 mg by mouth 4 (four) times daily as needed. For loose stool        Objective: BP 148/68  Pulse 65  Temp(Src) 99.1 F (37.3 C) (Rectal)  Resp 17  SpO2 100%  Exam: General: Cachectic, laying in stretcher in no acute distress, curled up in fetal position HEENT: Left pupil is pinpoint, right pupil is 4-45mm, neither is reactive to light Cardiovascular: RRR, no murmur Respiratory: Clear to auscultation bilaterally Abdomen: Soft, non-tender, non-distended Extremities: No edema, moves extremities spontaneously Skin: No cyanosis Neuro: Alert when called and touched. Not responsive to commands  Labs and Imaging: CBC BMET   Recent Labs Lab 07/13/13 2206  WBC 8.4  HGB 11.3*  HCT 34.9*  PLT 277    Recent Labs Lab 07/13/13 2206  NA 139  K 6.2*  CL 105  CO2 22  BUN 40*  CREATININE 1.60*  GLUCOSE 160*  CALCIUM 9.9     EKG (11/25) Sinus rhythm Consider left ventricular hypertrophy Anterior Q waves, possibly due to LVH No significant change since last tracing Confirmed by HORTON MD, COURTNEY ([id number]) on 07/13/2013 10:29:31 PM  CT Head (11/25) FINDINGS:  Stable advanced atrophy and small vessel disease. The brain  demonstrates no evidence of hemorrhage, acute infarction, edema,  mass effect, extra-axial fluid collection, hydrocephalus or mass  lesion. The skull is unremarkable.  IMPRESSION:  Stable atrophy and small vessel disease. No acute findings.   Jacquelin Hawking, MD 07/13/2013, 11:54 PM PGY-1, [place] FPTS Intern pager: [phone], text pages welcome  I have seen and examined the above patient and agree with the note.  My changes/addendum's are in blue.  Everlene Other DO Family Medicine PGY-2

## 2013-07-13 NOTE — ED Provider Notes (Signed)
Medical screening examination/treatment/procedure(s) were performed by non-physician practitioner and as supervising physician I was immediately available for consultation/collaboration.  EKG Interpretation    Date/Time:  Tuesday July 07 2013 11:33:53 EST Ventricular Rate:  68 PR Interval:  111 QRS Duration: 74 QT Interval:  419 QTC Calculation: 446 R Axis:   23 Text Interpretation:  Age not entered, assumed to be  79 years old for purpose of ECG interpretation Ventricular-paced complexes No further rhythm analysis attempted due to paced rhythm Anteroseptal infarct, old ST elevation, consider inferior injury ED PHYSICIAN INTERPRETATION AVAILABLE IN [facility] Confirmed by TEST, RECORD ([id number]) on 07/09/2013 8:51:17 AM             Juliet Rude. Rubin Payor, MD 07/13/13 1551

## 2013-07-13 NOTE — ED Notes (Signed)
Per EMS family states pt was at dinner and suddenly became unresponsive approximately 1 hour PTA. Family unable to give adequate description of event but did note some drooling. Pt had similar event last week which she was admitted for and determined to be a vagal response. Pt unable to follow commands with EMS to evaluate for stroke symptoms. Pt responds to painful stimuli. Pt has hx of dementia. Pt was seen by PCP this AM and cleared at baseline at that time.

## 2013-07-14 DIAGNOSIS — R55 Syncope and collapse: Secondary | ICD-10-CM | POA: Diagnosis present

## 2013-07-14 DIAGNOSIS — I10 Essential (primary) hypertension: Secondary | ICD-10-CM

## 2013-07-14 DIAGNOSIS — F0280 Dementia in other diseases classified elsewhere without behavioral disturbance: Secondary | ICD-10-CM

## 2013-07-14 DIAGNOSIS — M1990 Unspecified osteoarthritis, unspecified site: Secondary | ICD-10-CM

## 2013-07-14 DIAGNOSIS — E875 Hyperkalemia: Secondary | ICD-10-CM

## 2013-07-14 DIAGNOSIS — R4020 Unspecified coma: Secondary | ICD-10-CM

## 2013-07-14 LAB — BASIC METABOLIC PANEL
BUN: 40 mg/dL — ABNORMAL HIGH (ref 6–23)
BUN: 42 mg/dL — ABNORMAL HIGH (ref 6–23)
CO2: 21 mEq/L (ref 19–32)
Calcium: 9.7 mg/dL (ref 8.4–10.5)
Chloride: 106 mEq/L (ref 96–112)
Creatinine, Ser: 1.55 mg/dL — ABNORMAL HIGH (ref 0.50–1.10)
Creatinine, Ser: 1.71 mg/dL — ABNORMAL HIGH (ref 0.50–1.10)
GFR calc Af Amer: 34 mL/min — ABNORMAL LOW (ref >90)
GFR calc non Af Amer: 29 mL/min — ABNORMAL LOW (ref >90)
Glucose, Bld: 144 mg/dL — ABNORMAL HIGH (ref 70–99)
Glucose, Bld: 124 mg/dL — ABNORMAL HIGH (ref 70–99)
Potassium: 4.9 mEq/L (ref 3.5–5.1)

## 2013-07-14 LAB — CBC
MCH: 28.4 pg (ref 26.0–34.0)
MCHC: 32.6 g/dL (ref 30.0–36.0)
MCV: 87.1 fL (ref 78.0–100.0)
Platelets: 281 10*3/uL (ref 150–400)
RDW: 16.1 % — ABNORMAL HIGH (ref 11.5–15.5)
WBC: 6.5 10*3/uL (ref 4.0–10.5)

## 2013-07-14 LAB — GLUCOSE, CAPILLARY: Glucose-Capillary: 141 mg/dL — ABNORMAL HIGH (ref 70–99)

## 2013-07-14 MED ORDER — ENOXAPARIN SODIUM 30 MG/0.3ML ~~LOC~~ SOLN
30.0000 mg | SUBCUTANEOUS | Status: DC
  Administered 2013-07-14 – 2013-07-15 (×2): 30 mg via SUBCUTANEOUS
  Filled 2013-07-14 (×2): qty 0.3

## 2013-07-14 MED ORDER — CALCIUM CARBONATE ANTACID 500 MG PO CHEW
1.0000 | CHEWABLE_TABLET | ORAL | Status: DC | PRN
  Filled 2013-07-14: qty 2

## 2013-07-14 MED ORDER — SODIUM CHLORIDE 0.9 % IJ SOLN
3.0000 mL | Freq: Two times a day (BID) | INTRAMUSCULAR | Status: DC
  Administered 2013-07-14 – 2013-07-15 (×3): 3 mL via INTRAVENOUS

## 2013-07-14 MED ORDER — SODIUM CHLORIDE 0.9 % IV SOLN
INTRAVENOUS | Status: DC
  Administered 2013-07-14: 03:00:00 via INTRAVENOUS

## 2013-07-14 MED ORDER — ASPIRIN 81 MG PO CHEW
81.0000 mg | CHEWABLE_TABLET | Freq: Every day | ORAL | Status: DC
  Administered 2013-07-14 – 2013-07-15 (×2): 81 mg via ORAL
  Filled 2013-07-14 (×2): qty 1

## 2013-07-14 MED ORDER — AMLODIPINE BESYLATE 5 MG PO TABS
5.0000 mg | ORAL_TABLET | Freq: Every day | ORAL | Status: DC
  Administered 2013-07-14 – 2013-07-15 (×2): 5 mg via ORAL
  Filled 2013-07-14 (×2): qty 1

## 2013-07-14 MED ORDER — CLONIDINE HCL 0.1 MG PO TABS
0.1000 mg | ORAL_TABLET | Freq: Every evening | ORAL | Status: DC
  Filled 2013-07-14: qty 1

## 2013-07-14 MED ORDER — LOPERAMIDE HCL 2 MG PO CAPS: 2.0000 mg | ORAL_CAPSULE | Freq: Four times a day (QID) | ORAL | Status: DC | PRN

## 2013-07-14 MED ORDER — ACETAMINOPHEN 500 MG PO TABS: 500.0000 mg | ORAL_TABLET | ORAL | Status: DC | PRN

## 2013-07-14 MED ORDER — DONEPEZIL HCL 10 MG PO TABS
10.0000 mg | ORAL_TABLET | Freq: Every morning | ORAL | Status: DC
  Administered 2013-07-14: 10 mg via ORAL
  Filled 2013-07-14: qty 1

## 2013-07-14 NOTE — Progress Notes (Signed)
CSW (Clinical Child psychotherapist) received consult. Pt is observation with Medicare so will not qualify for SNF unless family is wanting to pay privately. CSW will assess after PT/OT have evaluated.  Wanisha Shiroma, LCSWA [phone]

## 2013-07-14 NOTE — Evaluation (Signed)
Clinical/Bedside Swallow Evaluation Patient Details  Name: Kristina Alvarez MRN: [id number] Date of Birth: 03-10-1926  Today's Date: 07/14/2013 Time: 4098-1191 SLP Time Calculation (min): 20 min  Past Medical History:  Past Medical History  Diagnosis Date  . Alzheimer's dementia     Alert to Name only as baseline  . Hypertension   . Diabetes 1.5, managed as type 2   . Arthritis    Past Surgical History:  Past Surgical History  Procedure Laterality Date  . Eye surgery     HPI:  79 y.o. female presenting with loss of consciousness. PMH is significant for syncope, Alzheimers dementia, hypertension, DM type2.  Syncopal episode vs seizure. History not entirely convincing for seizure from witnessed account. Had period that is suggestive of postictal period. Had a previous syncope workup last week. EKG unchanged from previous. Last echo on 07/08/2013 showed EF of 65-70%. CT head negative. CXR no acute process.   Assessment / Plan / Recommendation Clinical Impression  SLP familiar with pt. from admission last week.  Pt.'s swallow function is unchanged from bedside assessment last week.  Proper positioning is difficult to achieve with lower extremity contractures.  No overt signs of aspiration, however subtle indications of pharyngeal deficits with audible swallow/sluggish laryngeal elevation.  Pt. does not appear to be aspirating rather possible pharyngeal residue.  Possible esophageal factors present with belching and cough x 1 at end of session.  CXR's on file since 5/14 have been negative for acute process.  Behavioral and environmental factors affecting swallow safety include severe dementia, impulsivity (eats fast and guzzles drinks if not supervised/assisted).  Recommend Dys 1 diet texture and thin liquids, straws ok but must remove from mouth after small sip, optimum positioning, full assistance and crush meds.  SLP will follow for minimum one session.      Aspiration Risk  Severe     Diet Recommendation Dysphagia 2 (Fine chop);Thin liquid   Liquid Administration via: Straw;Cup Medication Administration: Crushed with puree Supervision: Staff to assist with self feeding;Full supervision/cueing for compensatory strategies;Trained caregiver to feed patient Compensations: Slow rate;Small sips/bites;Check for pocketing Postural Changes and/or Swallow Maneuvers: Seated upright 90 degrees;Upright 30-60 min after meal    Other  Recommendations Oral Care Recommendations: Oral care BID   Follow Up Recommendations  Skilled Nursing facility    Frequency and Duration min 1 x/week  2 weeks   Pertinent Vitals/Pain WDL         Swallow Study         Oral/Motor/Sensory Function Overall Oral Motor/Sensory Function:  (unable to formally assess)   Ice Chips Ice chips: Not tested   Thin Liquid Thin Liquid: Impaired Presentation: Straw Oral Phase Functional Implications: Oral holding Pharyngeal  Phase Impairments: Suspected delayed Swallow (audible swallow, sluggish)    Nectar Thick Nectar Thick Liquid: Not tested   Honey Thick Honey Thick Liquid: Not tested   Puree Puree: Impaired Pharyngeal Phase Impairments:  (audible swallow)   Solid    Functional Assessment Tool Used: clinical judgement Functional Limitations: Swallowing Swallow Current Status (Y7829): At least 60 percent but less than 80 percent impaired, limited or restricted Swallow Goal Status [phone]): At least 40 percent but less than 60 percent impaired, limited or restricted  Solid: Not tested       Royce Macadamia M.Ed [employer] [phone]  07/14/2013

## 2013-07-14 NOTE — Progress Notes (Signed)
Clinical Social Work Department BRIEF PSYCHOSOCIAL ASSESSMENT 07/14/2013  Patient:  Kristina Alvarez, Kristina Alvarez     Account Number:  [id number]     Admit date:  07/13/2013  Clinical Social Worker:  Harless Nakayama  Date/Time:  07/14/2013 04:00 PM  Referred by:  Physician  Date Referred:  07/14/2013 Referred for  SNF Placement   Other Referral:   Interview type:  Family Other interview type:   Spoke with pt brother at bedside    PSYCHOSOCIAL DATA Living Status:  FAMILY Admitted from facility:   Level of care:   Primary support nameCleophus Alvarez [phone] Primary support relationship to patient:  S[place] Degree of support available:   Pt has supportive brother    CURRENT CONCERNS Current Concerns  Post-Acute Placement   Other Concerns:    SOCIAL WORK ASSESSMENT / PLAN CSW informed that dc plan was for pt to return home with brother however at this time pt brother feels he is unable to provide care. CSW spoke with brother and confirmed he is agreeable to SNF. Pt brother believes that is in the best interest of th pt. CSW explained SNF referral process. Pt brother agreeable to faxing pt out to [facility].   Assessment/plan status:  Psychosocial Support/Ongoing Assessment of Needs Other assessment/ plan:   Information/referral to community resources:   SNF list to be provided with bed offers.    PATIENT'S/FAMILY'S RESPONSE TO PLAN OF CARE: Pt family agreeable to SNF       Dinah Lupa, LCSWA [phone]

## 2013-07-14 NOTE — ED Provider Notes (Signed)
Medical screening examination/treatment/procedure(s) were conducted as a shared visit with non-physician practitioner(s) and myself.  I personally evaluated the patient during the encounter.  EKG Interpretation    Date/Time:  Monday July 13 2013 20:57:18 EST Ventricular Rate:  58 PR Interval:  112 QRS Duration: 70 QT Interval:  437 QTC Calculation: 429 R Axis:   55 Text Interpretation:  Sinus rhythm Consider left ventricular hypertrophy Anterior Q waves, possibly due to LVH No significant change since last tracing Confirmed by Tyke Outman  MD, Toni Amend ([id number]) on 07/13/2013 10:29:31 PM           Patient presents with syncope/altered mental status.  She is chronically ill-appearing and is noncontributory to history taking. Patient's family member notes that she had an episode where she was sitting down and lost consciousness. She was not arousable that time. On initial evaluation, the patient is actively biting her finger and just repeat everything that I say. She is chronically ill-appearing and contractured. She is very thin. She does appear to move all 4 extremities but does not follow commands. Full infectious workup initiated. Head CT unchanged. Patient will be admitted for further workup  Shon Baton, MD 07/14/13 [phone]

## 2013-07-14 NOTE — Progress Notes (Addendum)
ADDENDUM: 12:20 pm call back received from patient's brother [place]; PMT Meeting time confirmed for tomorrow Wednesday 11/26 @ 1:00 pm Valente David, RN [phone]    Thank you for consulting the Palliative Medicine Team at [facility] to meet your patient's and family's needs.   The reason that you asked [place] to see your patient is for goals of care discussion  We have scheduled your patient for a meeting: TBD called patient's brother and await a call back  The Surrogate decision maker is: Morley Kos brother/HCPOA [phone]  Other family members that need to be present: TBD  Your patient is unable to participate: advanced dementia  Additional Narrative:  patient seen at bedside in fetal position, talking to self; no family at bedside - attempted to contact brother Morley Kos at numbers provided h: [phone] and c: [phone] messages left await call back to schedule meeting time    Valente David, RN 07/14/2013, 12:10 PM Palliative Medicine Team RN Liaison [phone]

## 2013-07-14 NOTE — Progress Notes (Signed)
FMTS Attending Note  The plan of care was discussed with the resident team. I agree with the assessment and plan as documented by the resident.   Agree with palliative care consult to help with goals of care. If full evaluation is wanted by the family will consider EEG/neurology consult for further evaluation of LOC.   Donnella Sham MD

## 2013-07-14 NOTE — Care Management Note (Unsigned)
    Page 1 of 1   07/14/2013     2:46:07 PM   CARE MANAGEMENT NOTE 07/14/2013  Patient:  Kristina Alvarez, Kristina Alvarez   Account Number:  [id number]  Date Initiated:  07/14/2013  Documentation initiated by:  GRAVES-BIGELOW,BRENDA  Subjective/Objective Assessment:   Pt admitted for syncopal episode vs seizure. Lives wit brother and attends an adult day care from 9:30 to 5:00pm. Brother gives pt meds before going to day care.     Action/Plan:   Brother did have concerns when CM called him in reference to how progressive dementia will be. CM will speak to brother upon arrival.   Anticipated DC Date:  07/15/2013   Anticipated DC Plan:  HOME W HOME HEALTH SERVICES      DC Planning Services  CM consult      Choice offered to / List presented to:             Status of service:  In process, will continue to follow Medicare Important Message given?   (If response is "NO", the following Medicare IM given date fields will be blank) Date Medicare IM given:   Date Additional Medicare IM given:    Discharge Disposition:    Per UR Regulation:  Reviewed for med. necessity/level of care/duration of stay  If discussed at Long Length of Stay Meetings, dates discussed:    Comments:  07/14/2013 Crystal Hutchinson RN, BSN, MSHL, CCM 2:38 CM met with patient's brother by bedside to discuss d/c planning. Brother verified decision for SNF admission at d/c d/t inability to provide 24/7 supervised care in the home. SW referral made. No further needs with CM at this time.

## 2013-07-14 NOTE — ED Provider Notes (Signed)
CSN: [id number]     Arrival date & time 07/13/13  2036 History   First MD Initiated Contact with Patient 07/13/13 2038     Chief Complaint  Patient presents with  . Altered Mental Status   (Consider location/radiation/quality/duration/timing/severity/associated sxs/prior Treatment) HPI Patient presents emergency department following a syncopal event, that occurred just prior to arrival.  Patient is unable to give me any history and there is no family members in the room.  EMS states the patient has not responded to them very well, and this is out of her norm.  She does have a history of dementia and was recently in the hospital for near syncope Past Medical History  Diagnosis Date  . Alzheimer's dementia     Alert to Name only as baseline  . Hypertension   . Diabetes 1.5, managed as type 2   . Arthritis    Past Surgical History  Procedure Laterality Date  . Eye surgery     Family History  Problem Relation Age of Onset  . Diabetes Brother    History  Substance Use Topics  . Smoking status: Never Smoker   . Smokeless tobacco: Not on file  . Alcohol Use: No   OB History   Grav Para Term Preterm Abortions TAB SAB Ect Mult Living                 Review of Systems Level V caveat applies due to dementia and altered mentation  Allergies  Review of patient's allergies indicates no known allergies.  Home Medications   Current Outpatient Rx  Name  Route  Sig  Dispense  Refill  . acetaminophen (TYLENOL) 500 MG tablet   Oral   Take 500-1,000 mg by mouth every 4 (four) hours as needed. For pain         . amLODipine (NORVASC) 5 MG tablet   Oral   Take 5 mg by mouth daily.         Marland Kitchen aspirin 81 MG chewable tablet   Oral   Chew 1 tablet (81 mg total) by mouth daily.   30 tablet   0   . calcium carbonate (TUMS - DOSED IN MG ELEMENTAL CALCIUM) 500 MG chewable tablet   Oral   Chew 1-2 tablets by mouth every 4 (four) hours as needed. For heartburn         . cloNIDine  (CATAPRES) 0.1 MG tablet   Oral   Take 0.1 mg by mouth every evening.         . donepezil (ARICEPT) 10 MG tablet   Oral   Take 10 mg by mouth every morning.          . loperamide (IMODIUM) 2 MG capsule   Oral   Take 2 mg by mouth 4 (four) times daily as needed. For loose stool          BP 127/64  Pulse 58  Temp(Src) 99.1 F (37.3 C) (Rectal)  Resp 14  SpO2 100% Physical Exam  Nursing note and vitals reviewed. Constitutional: She appears well-developed and well-nourished.  HENT:  Head: Normocephalic and atraumatic.  Neck: Normal range of motion. Neck supple.  Cardiovascular: Normal rate, regular rhythm and normal heart sounds.  Exam reveals no gallop and no friction rub.   No murmur heard. Pulmonary/Chest: Effort normal and breath sounds normal.  Neurological: She is alert.  Skin: Skin is warm and dry.    ED Course  Procedures (including critical care time) Labs Review Labs  Reviewed  CBC WITH DIFFERENTIAL - Abnormal; Notable for the following:    Hemoglobin 11.3 (*)    HCT 34.9 (*)    RDW 16.1 (*)    All other components within normal limits  COMPREHENSIVE METABOLIC PANEL - Abnormal; Notable for the following:    Potassium 6.2 (*)    Glucose, Bld 160 (*)    BUN 40 (*)    Creatinine, Ser 1.60 (*)    Albumin 3.3 (*)    Total Bilirubin 0.2 (*)    GFR calc non Af Amer 28 (*)    GFR calc Af Amer 33 (*)    All other components within normal limits  CG4 I-STAT (LACTIC ACID) - Abnormal; Notable for the following:    Lactic Acid, Venous 2.54 (*)    All other components within normal limits  URINALYSIS, ROUTINE W REFLEX MICROSCOPIC  TROPONIN I  BASIC METABOLIC PANEL  POCT I-STAT 3, BLOOD GAS (G3+)   Imaging Review Ct Head Wo Contrast  07/13/2013   CLINICAL DATA:  Altered mental status and unresponsive episode.  EXAM: CT HEAD WITHOUT CONTRAST  TECHNIQUE: Contiguous axial images were obtained from the base of the skull through the vertex without intravenous  contrast.  COMPARISON:  07/07/2013  FINDINGS: Stable advanced atrophy and small vessel disease. The brain demonstrates no evidence of hemorrhage, acute infarction, edema, mass effect, extra-axial fluid collection, hydrocephalus or mass lesion. The skull is unremarkable.  IMPRESSION: Stable atrophy and small vessel disease.  No acute findings.   Electronically Signed   By: Irish Lack M.D.   On: 07/13/2013 21:32   Dg Chest Portable 1 View  07/13/2013   CLINICAL DATA:  Altered mental status and unresponsive.  EXAM: PORTABLE CHEST - 1 VIEW  COMPARISON:  07/07/2013  FINDINGS: Stable chronic lung disease. There is no evidence of pulmonary edema, consolidation, pneumothorax, nodule or pleural fluid. The heart size is normal.  IMPRESSION: Stable chronic lung disease.  No acute findings.   Electronically Signed   By: Irish Lack M.D.   On: 07/13/2013 21:45    EKG Interpretation    Date/Time:  Monday July 13 2013 20:57:18 EST Ventricular Rate:  58 PR Interval:  112 QRS Duration: 70 QT Interval:  437 QTC Calculation: 429 R Axis:   55 Text Interpretation:  Sinus rhythm Consider left ventricular hypertrophy Anterior Q waves, possibly due to LVH No significant change since last tracing Confirmed by HORTON  MD, COURTNEY ([id number]) on 07/13/2013 10:29:31 PM            MDM   1. Syncope    Patient be admitted to the family practice service   Carlyle Dolly, PA-C 07/14/13 0122

## 2013-07-14 NOTE — Progress Notes (Signed)
Family Medicine Teaching Service Daily Progress Note Intern Pager: [phone]  Patient name: Kristina Alvarez Medical record number: [id number] Date of birth: 1925-09-29 Age: 79 y.o. Gender: female  Primary Care Provider: Pcp Not In System Consultants: None Code Status: Full Code  Pt Overview and Major Events to Date:   Assessment and Plan: Kristina Alvarez is a 79 y.o. female presenting with loss of consciousness. PMH is significant for syncope, hypertension, DM type2.   # Loss of consciousness: syncopal episode vs seizure. History not entirely convincing for seizure from witnessed account. Had period that is suggestive of postictal period. Had a previous syncope workup last week. EKG unchanged from previous. Last echo on 07/08/2013 showed EF of 65-70%. CT head negative.  Cardiac monitoring  Will consider neurology consult in AM and possible EEG  # Hyperkalemia (Resolved): no EKG changes suggesting hyperkalemia. Might be due to hemolysis. Repeat bmet revealed normal potassium of 5.1 and now 4.9.  # Alzheimer's dementia: on aricept. Continued. Obtaining PT/OT evaluations. F/u palliative care consult. Brother (power of attorney) is amenable to speaking with them  # Chronic conditions  Hypertension: on aspirin, clonidine and amlodipine at home. Continued  Diabetes mellitus, type 2: Not currently on any medications outpatient. Well controlled. Last A1C on 11/19 was 6.3  Arthritis: has tylenol for pain. continued  FEN/GI:  Dysphagia 2 (fine chop) with thin liquid MIVF: NS @100ml /hr  Prophylaxis: lovenox  Disposition: Discharge pending CSW/PT/OT recommendations  Subjective: Replied to greeting but otherwise not responding to any of my questions.  Objective: Temp:  [97.7 F (36.5 C)-99.1 F (37.3 C)] 97.7 F (36.5 C) (11/25 0206) Pulse Rate:  [58-69] 61 (11/25 0206) Resp:  [12-18] 18 (11/25 0206) BP: (119-148)/(61-88) 133/61 mmHg (11/25 0206) SpO2:  [96 %-100 %] 96 % (11/25  0206)  Physical Exam: General: Laying in bed in fetal position, in no acute distress, talking to herself Cardiovascular: RRR, no murmurs, rubs or gallops Respiratory: Clear to auscultation bilaterally Abdomen: Soft, non-tender, non-distended Extremities: no edema  Laboratory:  Recent Labs Lab 07/07/13 1143 07/08/13 0126 07/13/13 2206  WBC 6.7 14.5* 8.4  HGB 11.0* 10.1* 11.3*  HCT 34.9* 31.2* 34.9*  PLT 264 272 277    Recent Labs Lab 07/08/13 0126 07/13/13 2206 07/14/13 0056  NA 142 139 140  K 4.7 6.2* 5.1  CL 107 105 106  CO2 25 22 23   BUN 34* 40* 40*  CREATININE 1.38* 1.60* 1.55*  CALCIUM 9.8 9.9 9.7  PROT  --  8.3  --   BILITOT  --  0.2*  --   ALKPHOS  --  87  --   ALT  --  13  --   AST  --  26  --   GLUCOSE 123* 160* 144*    Imaging/Diagnostic Tests:  No recent imaging  Kristina Hawking, MD 07/14/2013, 4:11 AM PGY-1, [facility] FPTS Intern pager: [phone], text pages welcome

## 2013-07-14 NOTE — Evaluation (Signed)
Physical Therapy Evaluation Patient Details Name: Kristina Alvarez MRN: [id number] DOB: 11-May-1926 Today's Date: 07/14/2013 Time: 6578-4696 PT Time Calculation (min): 30 min  PT Assessment / Plan / Recommendation History of Present Illness  Kristina Alvarez is a 79 y.o. female presenting with loss of consciousness. PMH is significant for syncope, hypertension, DM type2  Clinical Impression  Patient is still cognitively impaired and unable to communicate with therapy.  Pt is unresponsive with mumbling when lying down, but seems more aware once sitting up.  Pt is able to walk with assistance, though will need constant A again once d/c.  Pt will benefit from skilled PT intervention in the acute setting to increase functional status and avoid deconditioning.    PT Assessment  Patient needs continued PT services    Follow Up Recommendations  Supervision/Assistance - 24 hour;SNF (She certainly requires a high level of care;will assess if adult day care will take her back and brothers wishes and if he still thinks he can take care of her at home)          Equipment Recommendations  None recommended by PT       Frequency Min 3X/week    Precautions / Restrictions Precautions Precautions: Fall Restrictions Weight Bearing Restrictions: No   Pertinent Vitals/Pain None reported      Mobility  Bed Mobility Bed Mobility: Supine to Sit Supine to Sit: 2: Max assist Details for Bed Mobility Assistance: Requires tactile cues to move LE to EOB, requires B hand hold to pull into sitting upright Transfers Transfers: Stand to Sit;Sit to Stand Sit to Stand: 3: Mod assist Stand to Sit: 3: Mod assist Details for Transfer Assistance: B UE and trunk support for safety while standing.  Once movement is initiated, she has the strength to maintain standing on her own. Ambulation/Gait Ambulation/Gait Assistance: 1: +2 Total assist Ambulation/Gait: Patient Percentage: 80% Ambulation Distance (Feet):  300 Feet Assistive device: Rolling walker (pt requested;doesn't use at home due to cognitive deficits) Ambulation/Gait Assistance Details: 2 L O2 on [place].Poor forward posture with stooped lean over her w/c while walking. Not addressed due to cognitive complications. Gait Pattern: Step-through pattern        PT Diagnosis: Difficulty walking;Generalized weakness  PT Problem List: Decreased strength;Decreased activity tolerance;Decreased balance;Decreased mobility;Decreased coordination;Decreased cognition;Decreased safety awareness PT Treatment Interventions: DME instruction;Gait training;Stair training;Functional mobility training;Therapeutic activities;Therapeutic exercise;Neuromuscular re-education     PT Goals(Current goals can be found in the care plan section) Acute Rehab PT Goals Patient Stated Goal: return home PT Goal Formulation: With patient Time For Goal Achievement: 07/22/13 Potential to Achieve Goals: Good  Visit Information  Last PT Received On: 07/14/13 Reason Eval/Treat Not Completed: Other (comment) (Bed rest an) History of Present Illness: Kristina Alvarez is a 79 y.o. female presenting with loss of consciousness. PMH is significant for syncope, hypertension, DM type2       Prior Functioning  Home Living Family/patient expects to be discharged to:: Private residence Living Arrangements: Other relatives (Brother) Available Help at Discharge: Family Type of Home: House Home Access: Level entry Home Layout: One level Home Equipment: None Prior Function Level of Independence: Needs assistance Gait / Transfers Assistance Needed: yes ADL's / Homemaking Assistance Needed: yes Communication / Swallowing Assistance Needed: yes Comments: needs constant supervision and A Communication Communication: Other (comment) (decreased due to advanced dementia)    Cognition  Cognition Arousal/Alertness: Lethargic Behavior During Therapy: Impulsive Overall Cognitive Status:  History of cognitive impairments - at baseline  Extremity/Trunk Assessment Upper Extremity Assessment Upper Extremity Assessment: Defer to OT evaluation Lower Extremity Assessment Lower Extremity Assessment: Generalized weakness   Balance    End of Session PT - End of Session Equipment Utilized During Treatment: Gait belt Activity Tolerance: Patient tolerated treatment well Patient left: in bed;with call bell/phone within reach;with bed alarm set  GP    Barrie Dunker, SPT Pager:  [phone]  Barrie Dunker 07/14/2013, 11:56 AM

## 2013-07-14 NOTE — Progress Notes (Signed)
UR completed 

## 2013-07-14 NOTE — Evaluation (Signed)
Read, reviewed, and agree.  Aurorah Schlachter B. Sirus Labrie, PT, DPT #[id number]  

## 2013-07-14 NOTE — H&P (Signed)
FMTS Attending Admission Note: Renold Don MD Personal pager:  [phone] FPTS Service Pager:  [phone]  I  have seen and examined this patient, reviewed their chart. I have discussed this patient with the resident. I agree with the resident's findings, assessment and care plan.  Additionally:  Briefly, 79 yo F w/ dementia, DM2, and recent admit for syncope admitted for syncope.  Patient reportedly at home with brother, fell out of chair, questionable LOC prior or after fall.  Family not present currently for questioning.  Brought to ED and admitted to [facility] for further w/u.  Recent admit last week for the same.    Exam:   Gen:  Patient curled up and lying completely sideways in bed.  NAD.  Not alert/oriented.  Spontaneous, inappropriate laughter but otherwise nonverbal.  Cachectic  HEENT:  Discordant pupils.  Poor dentition.  Dry mucus membranes. Heart:  RRR.  Holosystolic Grade III murmur heard throughout, also with faint Grade 1 diastolic murmur heard at Right upper sternal border.  Lungs:  Clear anterior chest Abdomen:  Soft/no masses Ext:  Thin  Imp/Plan: 1.  Syncope:  - Unclear etiology or even true syncope.   - Negative w/u just last week - hold off on any Echo.  Would consider palliative prior to Neurology. - Overriding issue for her is we need to discuss goals of care.  She appears to be full code, which I do not agree with. - I wouldn't be surprised if family member who cares for her has caregiver fatigue.     Tobey Grim, MD 07/14/2013 8:33 AM

## 2013-07-15 DIAGNOSIS — I10 Essential (primary) hypertension: Secondary | ICD-10-CM

## 2013-07-15 DIAGNOSIS — Z515 Encounter for palliative care: Secondary | ICD-10-CM

## 2013-07-15 DIAGNOSIS — R531 Weakness: Secondary | ICD-10-CM

## 2013-07-15 DIAGNOSIS — F0280 Dementia in other diseases classified elsewhere without behavioral disturbance: Secondary | ICD-10-CM

## 2013-07-15 DIAGNOSIS — R627 Adult failure to thrive: Secondary | ICD-10-CM

## 2013-07-15 DIAGNOSIS — E119 Type 2 diabetes mellitus without complications: Secondary | ICD-10-CM

## 2013-07-15 DIAGNOSIS — R55 Syncope and collapse: Principal | ICD-10-CM

## 2013-07-15 DIAGNOSIS — R5381 Other malaise: Secondary | ICD-10-CM

## 2013-07-15 LAB — CBC
HCT: 30.9 % — ABNORMAL LOW (ref 36.0–46.0)
Hemoglobin: 9.8 g/dL — ABNORMAL LOW (ref 12.0–15.0)
MCH: 28.3 pg (ref 26.0–34.0)
MCHC: 31.7 g/dL (ref 30.0–36.0)
MCV: 89.3 fL (ref 78.0–100.0)
RDW: 16.3 % — ABNORMAL HIGH (ref 11.5–15.5)

## 2013-07-15 LAB — BASIC METABOLIC PANEL
BUN: 31 mg/dL — ABNORMAL HIGH (ref 6–23)
Creatinine, Ser: 1.28 mg/dL — ABNORMAL HIGH (ref 0.50–1.10)
GFR calc Af Amer: 43 mL/min — ABNORMAL LOW (ref >90)
GFR calc non Af Amer: 37 mL/min — ABNORMAL LOW (ref >90)
Glucose, Bld: 150 mg/dL — ABNORMAL HIGH (ref 70–99)
Potassium: 4.4 mEq/L (ref 3.5–5.1)
Sodium: 143 mEq/L (ref 135–145)

## 2013-07-15 LAB — GLUCOSE, CAPILLARY: Glucose-Capillary: 138 mg/dL — ABNORMAL HIGH (ref 70–99)

## 2013-07-15 MED ORDER — ACETAMINOPHEN 325 MG PO TABS: 650.0000 mg | ORAL_TABLET | Freq: Four times a day (QID) | ORAL | Status: AC | PRN

## 2013-07-15 NOTE — Progress Notes (Signed)
Family Medicine Teaching Service Daily Progress Note Intern Pager: [phone]  Patient name: Kristina Alvarez Medical record number: [id number] Date of birth: 1925-10-03 Age: 79 y.o. Gender: female  Primary Care Provider: Pcp Not In System Consultants: None Code Status: Full Code  Pt Overview and Major Events to Date:   Assessment and Plan: [name] is a 79 y.o. female presenting with loss of consciousness. PMH is significant for syncope, hypertension, DM type2.   # Alzheimer's dementia: on aricept at home  Discontinue aricept  PT recommends SNF/24 hour supervision  Palliative care consulted, meeting set up with pt's brother, Kristina Alvarez, for 1:00PM today  CSW says patient's brother is amenable to SNF placement, follow-up further recommendations  # Loss of consciousness: syncopal episode vs seizure. History not entirely convincing for seizure from witnessed account. Had period that is suggestive of postictal period. Had a previous syncope workup last week. EKG unchanged from previous. Last echo on 07/08/2013 showed EF of 65-70%. CT head negative.   Cardiac monitoring  # Hyperkalemia (Resolved): no EKG changes suggesting hyperkalemia. Might be due to hemolysis. Repeat bmet revealed normal potassium of 5.1 and now 4.9 (11/25).   # Chronic conditions  Hypertension: on aspirin, clonidine and amlodipine at home. Continued amlodipine Diabetes mellitus, type 2: Not currently on any medications outpatient. Well controlled. Last A1C on 11/19 was 6.3  Arthritis: has tylenol for pain. continued  FEN/GI:  Dysphagia 1 with thin liquid MIVF: NS @100ml /hr  Prophylaxis: lovenox  Disposition: Discharge to SNF, pending palliative care meeting recommendations  Subjective: In bed, curled up, .  Objective: Temp:  [97.1 F (36.2 C)-98.6 F (37 C)] 98.6 F (37 C) (11/26 0413) Pulse Rate:  [65-87] 87 (11/26 0413) Resp:  [18] 18 (11/26 0413) BP: (111-167)/(56-69) 167/69 mmHg  (11/26 0413) SpO2:  [96 %-100 %] 96 % (11/26 0413)  Physical Exam: General: Laying in bed in fetal position, in no acute distress, talking to herself Cardiovascular: RRR, 2/6 systolic murmur, Respiratory: Clear to auscultation bilaterally Abdomen: Soft, non-tender, non-distended Extremities: no edema  Laboratory:  Recent Labs Lab 07/13/13 2206 07/14/13 0730  WBC 8.4 6.5  HGB 11.3* 9.7*  HCT 34.9* 29.8*  PLT 277 281    Recent Labs Lab 07/13/13 2206 07/14/13 0056 07/14/13 0730  NA 139 140 143  K 6.2* 5.1 4.9  CL 105 106 109  CO2 22 23 21   BUN 40* 40* 42*  CREATININE 1.60* 1.55* 1.71*  CALCIUM 9.9 9.7 9.7  PROT 8.3  --   --   BILITOT 0.2*  --   --   ALKPHOS 87  --   --   ALT 13  --   --   AST 26  --   --   GLUCOSE 160* 144* 124*    Imaging/Diagnostic Tests:  No recent imaging  Jacquelin Hawking, MD 07/15/2013, 8:40 AM PGY-1,  Family Medicine FPTS Intern pager: [phone], text pages welcome

## 2013-07-15 NOTE — Progress Notes (Signed)
CSW (Clinical Child psychotherapist) prepared pt dc summary and placed with shadow chart. Non-emergent ambulance to be set up when facility calls to confirm pt family has arrived to do paperwork. Pt family previously informed that CSW would call for ambulance transport when paperwork was being completed. Pt nurse informed. CSW signing off.  Aliegha Paullin, LCSWA [phone]

## 2013-07-15 NOTE — Discharge Summary (Signed)
Family Medicine Teaching [facility] Discharge Summary  Patient name: NYCOLE KAWAHARA Medical record number: [id number] Date of birth: 1926/01/24 Age: 79 y.o. Gender: female Date of Admission: 07/13/2013  Date of Discharge: 07/15/2013 Admitting Physician: Tobey Grim, MD  Primary Care Provider: Pcp Not In System Consultants: palliative medicine  Indication for Hospitalization: syncope vs. seizure  Discharge Diagnoses/Problem List:  Loss of consciousness secondary to syncope vs. Seizure Severe alzheimer's dementia Hypertension Type 2 Diabetes Mellitus Arthritis DNR status  Disposition: to Skilled Nursing Facility ([facility])  Discharge Condition: stable  Brief Hospital Course:   FRANKIE ZITO is a 79 y.o. Female admitted to the hospital after presenting with loss of consciousness. PMH is significant for syncope, hypertension, DM type2.   # Alzheimer's dementia: at baseline, patient is only oriented to self. She was on Aricept upon admission, but secondary to her advanced dementia, we stopped this. Physical therapy evaluated patient and recommended SNF.   # Loss of consciousness: unclear etiology, possibly syncopal episode vs seizure. History not entirely convincing for seizure from witnessed account. Had period that was suggestive of postictal period. Had a previous syncope workup the week prior to admission that was unrevealing. EKG unchanged from previous. Last echo on 07/08/2013 showed EF of 65-70%. CT head was negative. Patient was monitored on telemetry while in the hospital. Palliative care was consulted, and after a goals of care discussion with pt's brother, deemed the goals of care to be comfort care with DNR status. Thus, no further workup regarding her syncope was pursued.   # Goals of care: per palliative medicine consult, recommendations copied below 1. Code Status: DNR/DNI-comfort is main focus of care  2. Scope of Treatment:  1. Vital Signs: as needed  (no more than daily) 2. Respiratory/Oxygen:for comfort only 3. Nutritional Support/Tube Feeds:no artificial feeding now or in the future 4. Antibiotics - decide if infection occurs 5. Blood Products: none 6. WUJ:WJXB 7. Review of Medications to be discontinued: minimized for comfort 8. Labs: none 9. Telemetry:none 10. Consults:none 4. Disposition: To SNF for long term placement with PCS to follow  3. Symptom Management:  1. Adult failure to thrive/weakness: Focus is comfort, comfort feeds 4. Psychosocial: Emotional support offered to brother. He understands the natrual trajectory of dementia and recognizes her time is limited   # Chronic conditions  Hypertension: on aspirin, clonidine and amlodipine at home. We stopped her clonidine during this admission.  Diabetes mellitus, type 2: Not currently on any medications outpatient. Well controlled.  Arthritis: gave tylenol prn pain  Issues for Follow Up:  - monitor for continued comfort and quality of life, as these are now the primary goals of care - recommend continued palliative care services at SNF  Dietary Recommendations: Dysphagia 2 (Fine chop);Thin liquid  Liquid Administration via: Straw;Cup  Medication Administration: Crushed with puree  Supervision: Staff to assist with self feeding;Full supervision/cueing for compensatory strategies;Trained caregiver to feed patient  Compensations: Slow rate;Small sips/bites;Check for pocketing  Postural Changes and/or Swallow Maneuvers: Seated upright 90 degrees;Upright 30-60 min after meal   Significant Procedures: none  Significant Labs and Imaging:   Recent Labs Lab 07/13/13 2206 07/14/13 0730 07/15/13 0948  WBC 8.4 6.5 7.4  HGB 11.3* 9.7* 9.8*  HCT 34.9* 29.8* 30.9*  PLT 277 281 256    Recent Labs Lab 07/13/13 2206 07/14/13 0056 07/14/13 0730 07/15/13 0948  NA 139 140 143 143  K 6.2* 5.1 4.9 4.4  CL 105 106 109 114*  CO2 22 23 21  21  GLUCOSE 160* 144* 124* 150*   BUN 40* 40* 42* 31*  CREATININE 1.60* 1.55* 1.71* 1.28*  CALCIUM 9.9 9.7 9.7 8.8  ALKPHOS 87  --   --   --   AST 26  --   --   --   ALT 13  --   --   --   ALBUMIN 3.3*  --   --   --    CXR - nonacute CT Head: Stable atrophy and small vessel disease. No acute findings. Lactate 2.54 Troponin normal  Results/Tests Pending at Time of Discharge: none  Discharge Medications:    Medication List    STOP taking these medications       calcium carbonate 500 MG chewable tablet  Commonly known as:  TUMS - dosed in mg elemental calcium     cloNIDine 0.1 MG tablet  Commonly known as:  CATAPRES     donepezil 10 MG tablet  Commonly known as:  ARICEPT      TAKE these medications       acetaminophen 325 MG tablet  Commonly known as:  TYLENOL  Take 2 tablets (650 mg total) by mouth every 6 (six) hours as needed for mild pain.     amLODipine 5 MG tablet  Commonly known as:  NORVASC  Take 5 mg by mouth daily.     aspirin 81 MG chewable tablet  Chew 1 tablet (81 mg total) by mouth daily.     loperamide 2 MG capsule  Commonly known as:  IMODIUM  Take 2 mg by mouth 4 (four) times daily as needed. For loose stool        Discharge Instructions: Please refer to Patient Instructions section of EMR for full details.  Patient was counseled important signs and symptoms that should prompt return to medical care, changes in medications, dietary instructions, activity restrictions, and follow up appointments.   Follow-Up Appointments: focus is on comfort care, can be followed up by SNF doctors as needed  Latrelle Dodrill, MD 07/15/2013, 2:37 PM PGY-2, [facility]

## 2013-07-15 NOTE — Progress Notes (Signed)
Nutrition Brief Note  Patient identified on the </= 12 Braden score report. Patient's goal of care if comfort measures.  No nutrition interventions warranted at this time.  Please consult RD as needed.   Maureen Chatters, RD, LDN Pager #: [phone] After-Hours Pager #: [phone]

## 2013-07-15 NOTE — Progress Notes (Signed)
FMTS Attending Note  I personally saw and evaluated the patient. The plan of care was discussed with the resident team. I agree with the assessment and plan as documented by the resident.   I spoke to the patient's brother who is the patients primary caretaker. She has had progressive decline over the past 10 years. He feels as though he can no longer care for her by himself and is interested in placement.   Agree with palliative care consult and appreciate their input. No further workup for syncope/loc at this time given severe dementia/FTT.   She is stable for transfer from a medical standpoint if placement is arranged today.   Donnella Sham MD

## 2013-07-15 NOTE — Consult Note (Signed)
Patient XL:KGMWNUU T Odeh      DOB: 11-17-1925      VOZ:[id number]     Consult Note from the Palliative Medicine Team at [facility]    Consult Requested by: Dr Randolm Idol     PCP: Pcp Not In System Reason for Consultation:Clarification of GOC and options     Phone Number:None  Assessment of patients Current state: ES-dementia, total care, overall failure to thrive.  Family now faced with advanced directives and anticipatory care needs.   Consult is for review of medical treatment options, clarification of goals of care and end of life issues, disposition and options, and symptom recommendation.  This NP Lorinda Creed reviewed medical records, received report from team, assessed the patient and then meet at the patient's bedside along with her brother/HPOA Morley Kos # [phone]  to discuss diagnosis prognosis, GOC, EOL wishes disposition and options.   A detailed discussion was had today regarding advanced directives.  Concepts specific to code status, artifical feeding and hydration, continued IV antibiotics and rehospitalization was had.  The difference between a aggressive medical intervention path  and a palliative comfort care path for this patient at this time was had.  Values and goals of care important to patient and family were attempted to be elicited.  Concept of Hospice and Palliative Care were discussed  Natural trajectory and expectations at EOL were discussed.  Questions and concerns addressed.  Hard Choices booklet left for review. Family encouraged to call with questions or concerns.  PMT will continue to support holistically.       Goals of Care: 1.  Code Status: DNR/DNI-comfort is main focus of care   2. Scope of Treatment: 1. Vital Signs: daily  2. Respiratory/Oxygen:for comfort only 3. Nutritional Support/Tube Feeds:no artificial feeding now or in the future 4. Antibiotics decide if infection occurs 5. Blood Products: none 6. IEP:PIRJ 7. Review of  Medications to be discontinued: minimized for comfort 8. Labs: none 9. Telemetry:none 10. Consults:none   4. Disposition:  To SNF for long term placement with PCS to follow   3. Symptom Management:   1. Adult failure to thrive/weakness:  Focus is comfort             -comfort feeds    4. Psychosocial:  Emotional support offered to brother.  He understands the natrual trajectory of dementia and recognizes her time is limited    Patient Documents Completed or Given: Document Given Completed  Advanced Directives Pkt    MOST  yes  DNR    Gone from [employer]    Hard Choices yes     Brief HPI: 79 yo F w/ dementia, DM2, and recent admit for syncope admitted for syncope   ES dementai, need for long term care palcement    ROS: unable to illicit due to altered cognition    PMH:  Past Medical History  Diagnosis Date  . Alzheimer's dementia     Alert to Name only as baseline  . Hypertension   . Diabetes 1.5, managed as type 2   . Arthritis      PSH: Past Surgical History  Procedure Laterality Date  . Eye surgery     I have reviewed the FH and SH and  If appropriate update it with new information. No Known Allergies Scheduled Meds: . amLODipine  5 mg Oral Daily  . aspirin  81 mg Oral Daily  . enoxaparin (LOVENOX) injection  30 mg Subcutaneous Q24H  . sodium chloride  3 mL Intravenous Q12H   Continuous Infusions:  PRN Meds:.acetaminophen, loperamide    BP 167/69  Pulse 87  Temp(Src) 98.6 F (37 C) (Oral)  Resp 18  Wt 36.152 kg (79 lb 11.2 oz)  SpO2 96%   PPS:30 % at best   Intake/Output Summary (Last 24 hours) at 07/15/13 1419 Last data filed at 07/15/13 1053  Gross per 24 hour  Intake    123 ml  Output      0 ml  Net    123 ml    Physical Exam:  General: chronically ill appearing, NAD HEENT:  Mm, no exudate, poor dentation Chest:   CTA CVS: RRR Abdomen:soft NT +BS Ext: with out edema Neuro:diminished cognition, unable to follow commands,  non verbal,   Labs: CBC    Component Value Date/Time   WBC 7.4 07/15/2013 0948   RBC 3.46* 07/15/2013 0948   HGB 9.8* 07/15/2013 0948   HCT 30.9* 07/15/2013 0948   PLT 256 07/15/2013 0948   MCV 89.3 07/15/2013 0948   MCH 28.3 07/15/2013 0948   MCHC 31.7 07/15/2013 0948   RDW 16.3* 07/15/2013 0948   LYMPHSABS 1.3 07/13/2013 2206   MONOABS 1.0 07/13/2013 2206   EOSABS 0.1 07/13/2013 2206   BASOSABS 0.0 07/13/2013 2206    BMET    Component Value Date/Time   NA 143 07/15/2013 0948   K 4.4 07/15/2013 0948   CL 114* 07/15/2013 0948   CO2 21 07/15/2013 0948   GLUCOSE 150* 07/15/2013 0948   BUN 31* 07/15/2013 0948   CREATININE 1.28* 07/15/2013 0948   CALCIUM 8.8 07/15/2013 0948   GFRNONAA 37* 07/15/2013 0948   GFRAA 43* 07/15/2013 0948    CMP     Component Value Date/Time   NA 143 07/15/2013 0948   K 4.4 07/15/2013 0948   CL 114* 07/15/2013 0948   CO2 21 07/15/2013 0948   GLUCOSE 150* 07/15/2013 0948   BUN 31* 07/15/2013 0948   CREATININE 1.28* 07/15/2013 0948   CALCIUM 8.8 07/15/2013 0948   PROT 8.3 07/13/2013 2206   ALBUMIN 3.3* 07/13/2013 2206   AST 26 07/13/2013 2206   ALT 13 07/13/2013 2206   ALKPHOS 87 07/13/2013 2206   BILITOT 0.2* 07/13/2013 2206   GFRNONAA 37* 07/15/2013 0948   GFRAA 43* 07/15/2013 0948      Time In Time Out Total Time Spent with Patient Total Overall Time  1200 1400 110  120 min    Greater than 50%  of this time was spent counseling and coordinating care related to the above assessment and plan.  Lorinda Creed NP  Palliative Medicine Team Team Phone # [phone] Pager [phone]

## 2013-07-15 NOTE — Progress Notes (Signed)
OT Cancellation Note  Patient Details Name: RETINA BERNARDY MRN: [id number] DOB: April 04, 1926   Cancelled Treatment:    Reason Eval/Treat Not Completed: Other (comment) Pt to D/C to SNF. Defer OT to SNF. [facility] Payslee Bateson, OTR/L  [phone] 07/15/2013 07/15/2013, 4:50 PM

## 2013-07-15 NOTE — Progress Notes (Addendum)
Clinical Social Work Department CLINICAL SOCIAL WORK PLACEMENT NOTE 07/15/2013  Patient:  IRJA, WHELESS  Account Number:  [id number] Admit date:  07/13/2013  Clinical Social Worker:  Sharol Harness, Theresia Majors  Date/time:  07/15/2013 08:30 AM  Clinical Social Work is seeking post-discharge placement for this patient at the following level of care:   SKILLED NURSING   (*CSW will update this form in Epic as items are completed)   07/15/2013  Patient/family provided with [facility] Department of Clinical Social Work's list of facilities offering this level of care within the geographic area requested by the patient (or if unable, by the patient's family).  07/15/2013  Patient/family informed of their freedom to choose among providers that offer the needed level of care, that participate in Medicare, Medicaid or managed care program needed by the patient, have an available bed and are willing to accept the patient.  07/15/2013  Patient/family informed of [facility]' ownership interest in [facility], as well as of the fact that they are under no obligation to receive care at this facility.  PASARR submitted to EDS on 07/15/2013 PASARR number received from [employer]DS on 07/15/2013  FL2 transmitted to all facilities in geographic area requested by pt/family on  07/15/2013 FL2 transmitted to all facilities within larger geographic area on   Patient informed that his/her managed care company has contracts with or will negotiate with  certain facilities, including the following:     Patient/family informed of bed offers received:  07/15/13 Patient chooses bed at [facility] Physician recommends and patient chooses bed at    Patient to be transferred to [facility] on  07/15/2013 Patient to be transferred to facility by [facility]  The following physician request were entered in Epic:   Additional Comments:  Nateisha Moyd, LCSWA [phone]

## 2013-07-15 NOTE — Discharge Summary (Signed)
I agree with the discharge summary as documented.   Geneve Kimpel MD  

## 2013-10-18 DEATH — deceased

## 2013-11-17 ENCOUNTER — Telehealth: Payer: Self-pay

## 2013-11-17 NOTE — Telephone Encounter (Signed)
[facility]

## 2015-10-12 IMAGING — CT CT HEAD W/O CM
1 series · 16 of 28 positions shown, 20 images · non-contrast
Comparison: 09/09/2012

CLINICAL DATA: Altered mental status.

EXAM:
CT HEAD WITHOUT CONTRAST
TECHNIQUE: Contiguous axial images were obtained from the base of the skull
through the vertex without intravenous contrast.

[Series 2: head 5.0 h30s · axial · 0.40mm/px · z∈[-58,+67]mm · 16 of 28 slices shown, 20 images]
[im 2/28  brain]
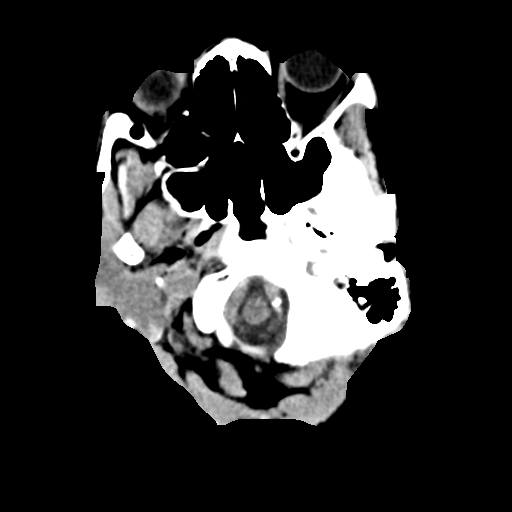
[im 2/28  bone]
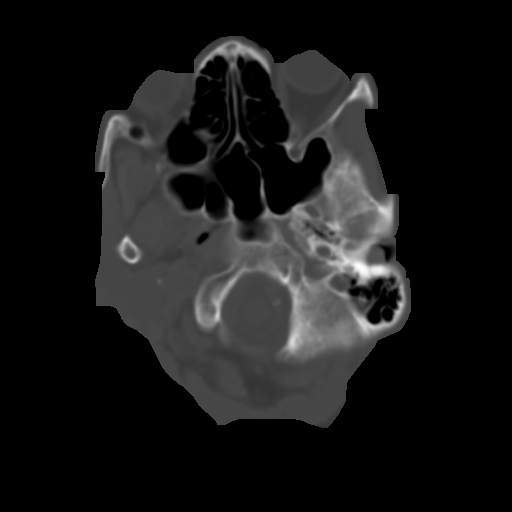
[im 4/28  brain]
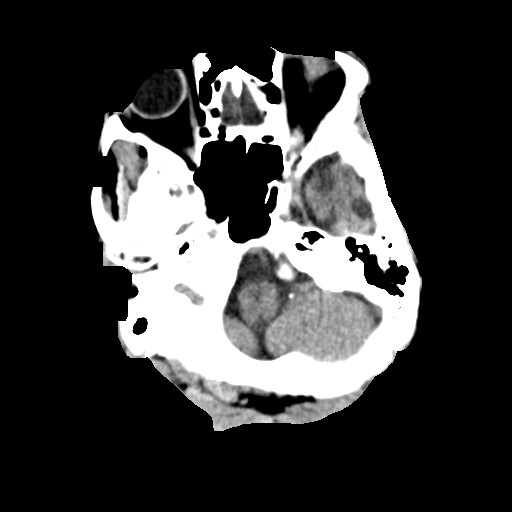
[im 6/28  brain]
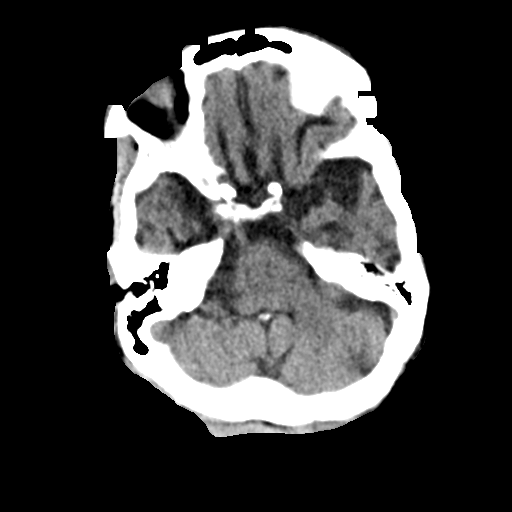
[im 7/28  brain]
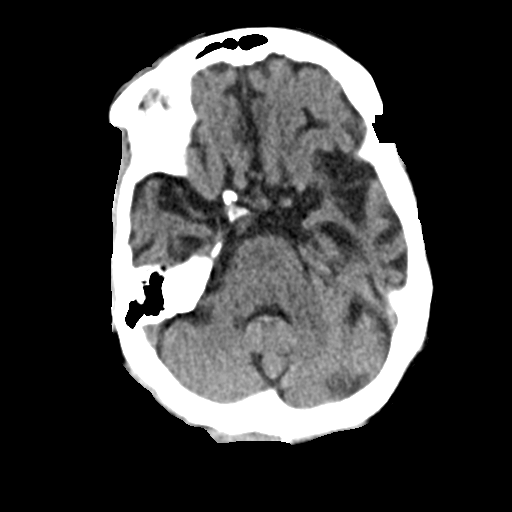
[im 9/28  brain]
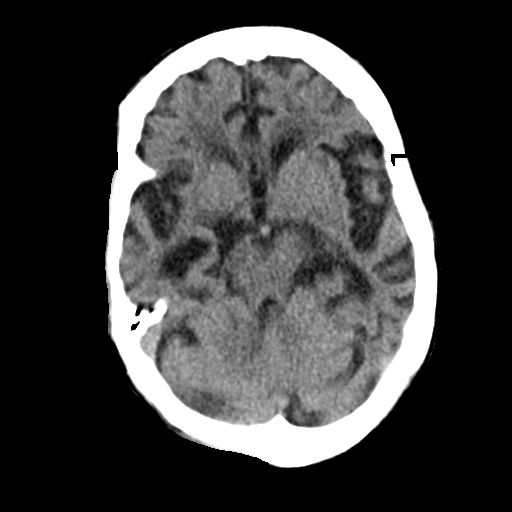
[im 9/28  bone]
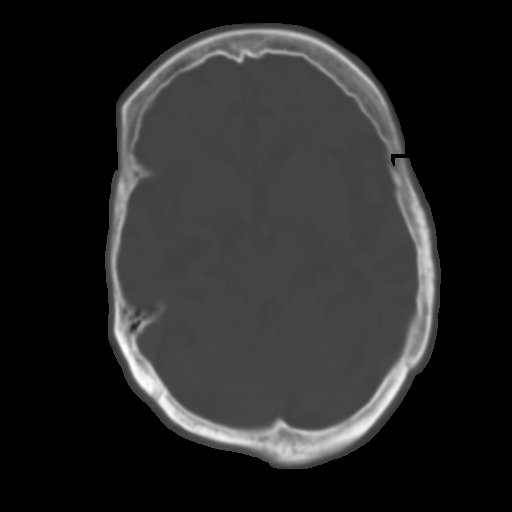
[im 10/28  brain]
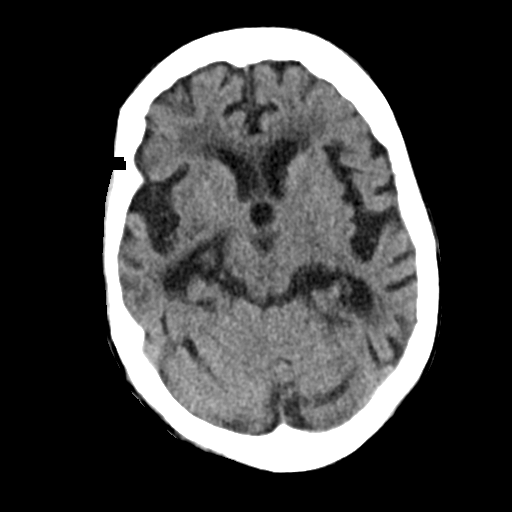
[im 12/28  brain]
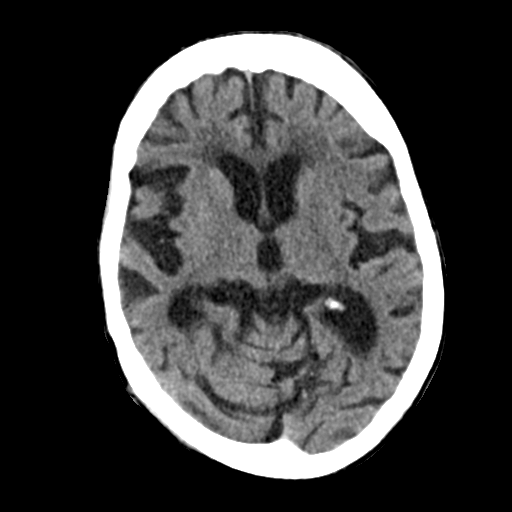
[im 14/28  brain]
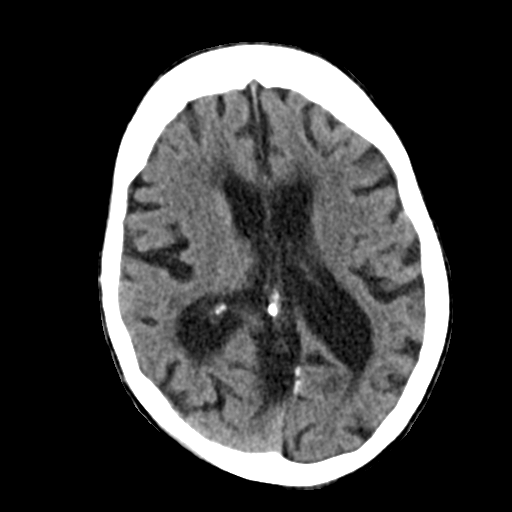
[im 15/28  brain]
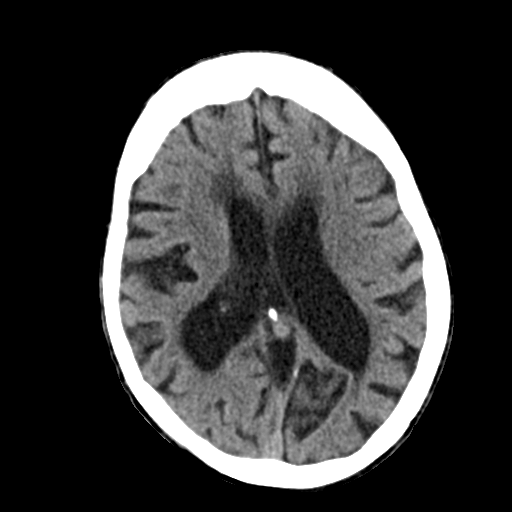
[im 15/28  bone]
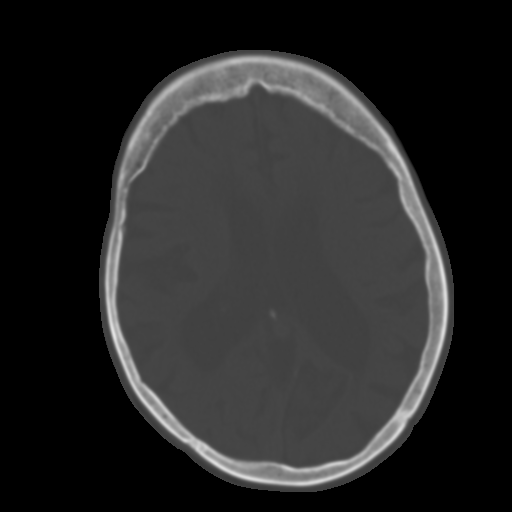
[im 17/28  brain]
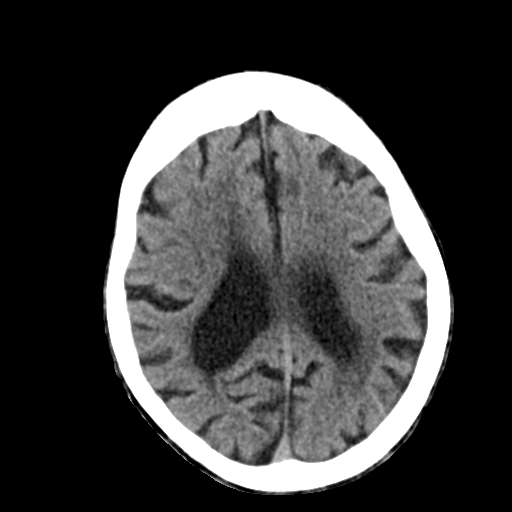
[im 19/28  brain]
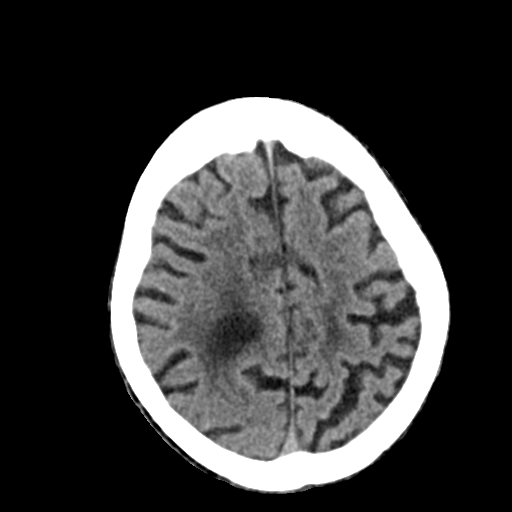
[im 20/28  brain]
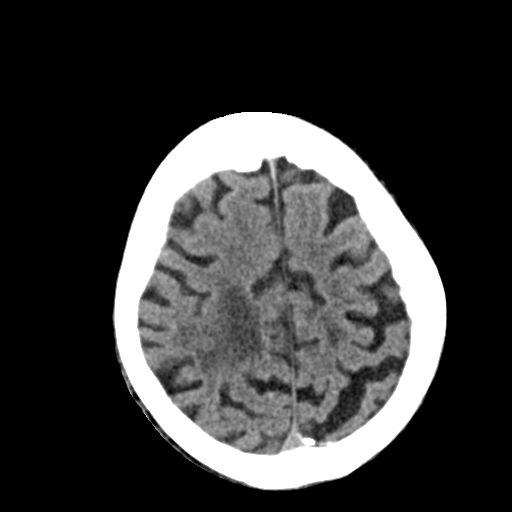
[im 22/28  brain]
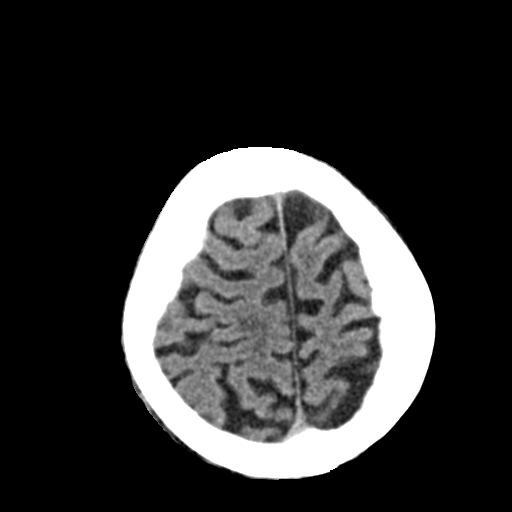
[im 22/28  bone]
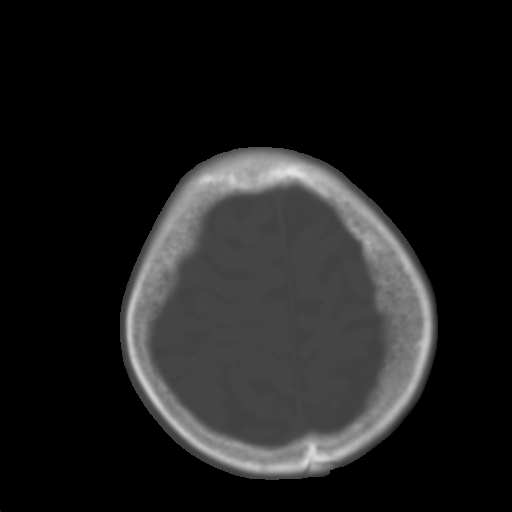
[im 23/28  brain]
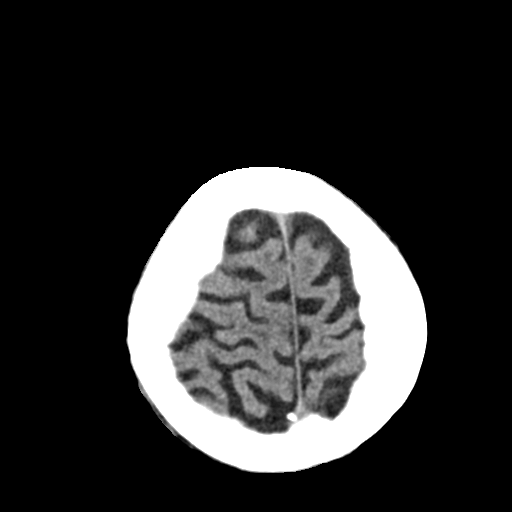
[im 25/28  brain]
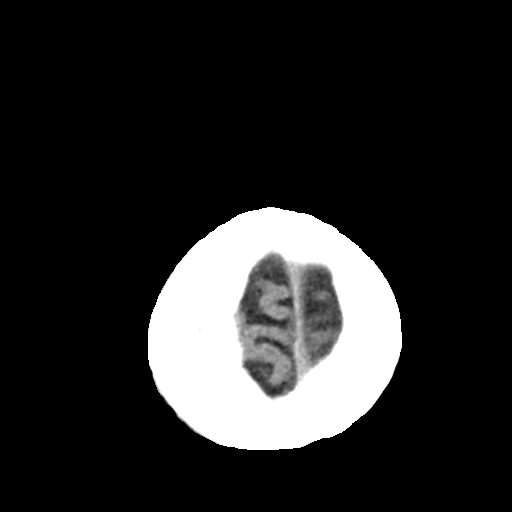
[im 27/28  brain]
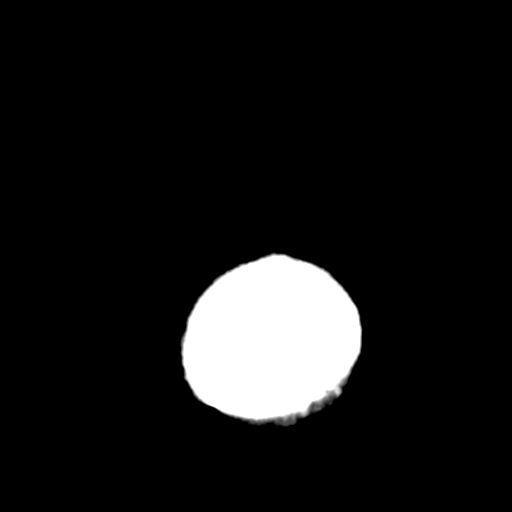

[16 of 28 positions shown; findings below may reference images not displayed]

FINDINGS: No intracranial hemorrhage.

Small vessel disease type changes most notable right parietal lobe
without CT evidence of large acute infarct.

Global atrophy.  Ventricular prominence similar to prior exam.

No intracranial mass lesion noted on this unenhanced exam.

Vascular calcifications.
IMPRESSION: No intracranial hemorrhage or CT evidence of large acute infarct.

Please see above.

## 2015-10-18 IMAGING — CR DG CHEST 1V PORT
1 series · 1 of 1 positions shown · non-contrast
Comparison: 07/07/2013

CLINICAL DATA: Altered mental status and unresponsive.

EXAM:
PORTABLE CHEST - 1 VIEW

[AP]
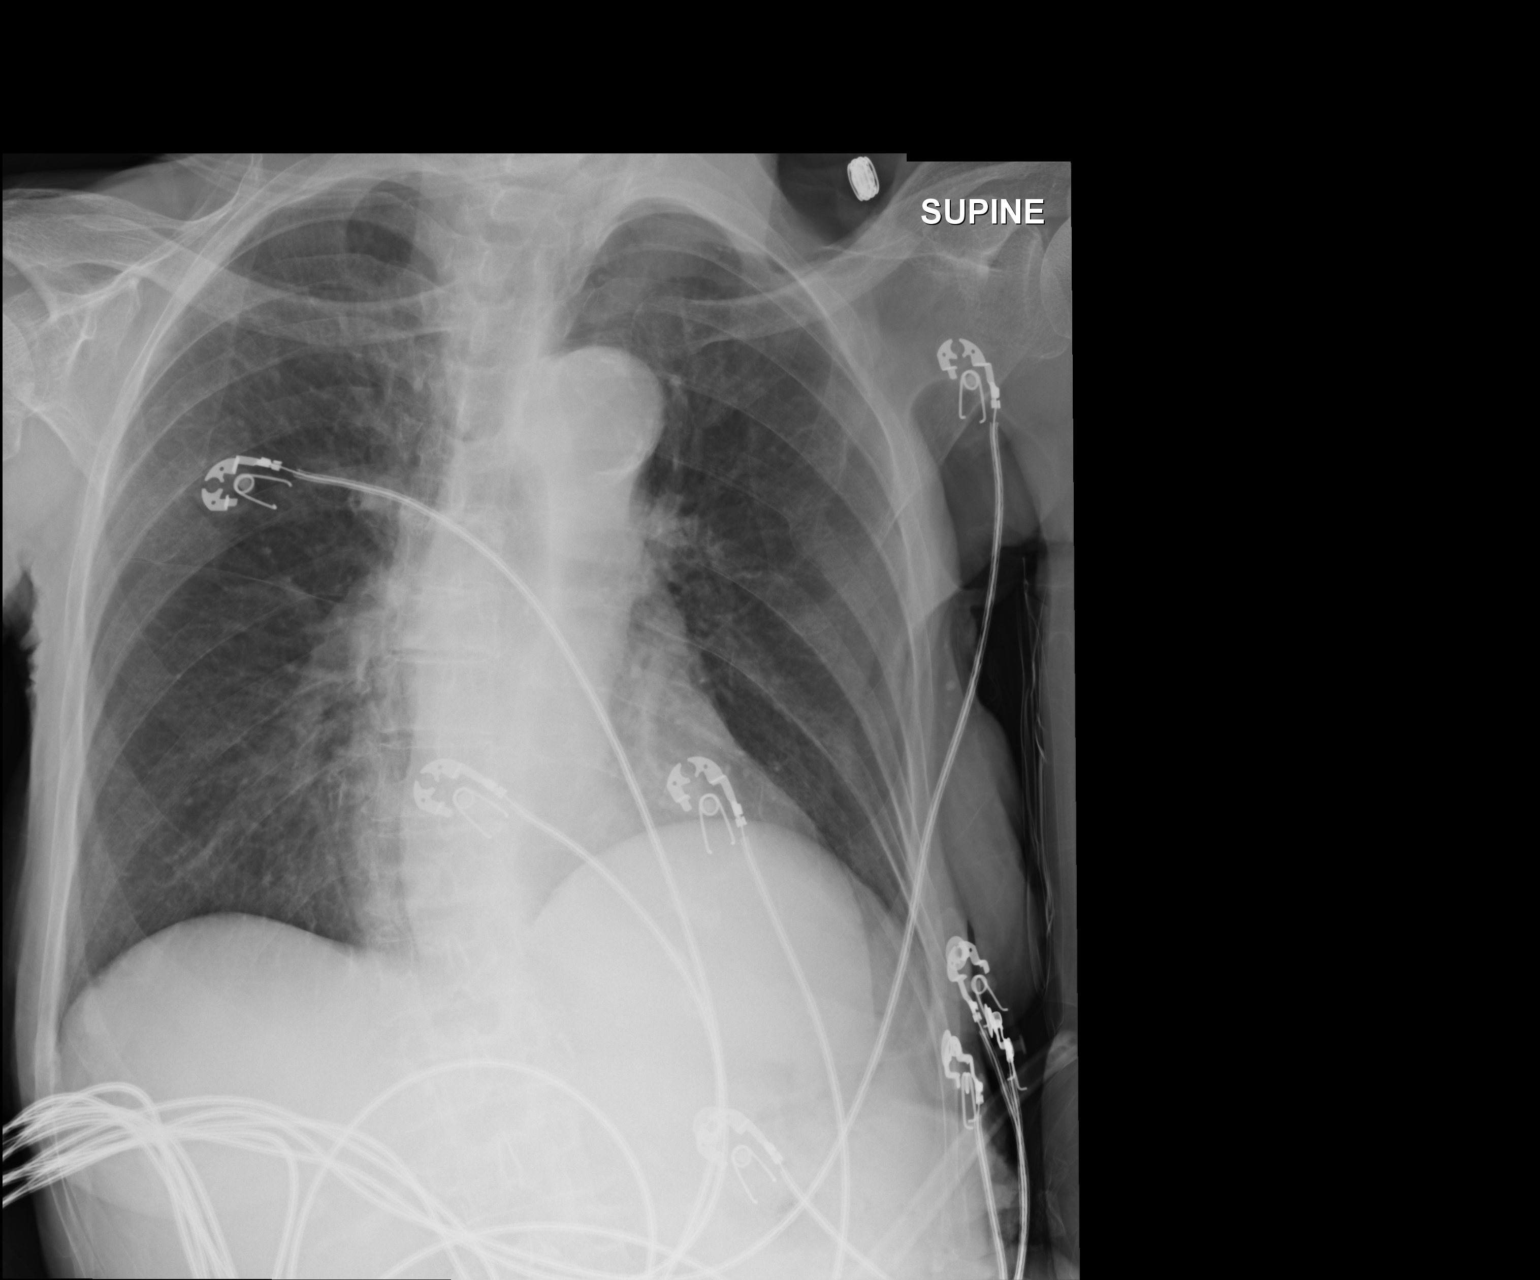

[1 of 1 positions shown; findings below may reference images not displayed]

FINDINGS: Stable chronic lung disease. There is no evidence of pulmonary
edema, consolidation, pneumothorax, nodule or pleural fluid. The
heart size is normal.
IMPRESSION: Stable chronic lung disease.  No acute findings.
